# Patient Record
Sex: Female | Born: 1970 | Race: White | Hispanic: No | Marital: Married | Smoking: Current every day smoker
Health system: Southern US, Community
[De-identification: ages and names within clinical notes are randomized; demographics above are authoritative.]

## PROBLEM LIST (undated history)

## (undated) DIAGNOSIS — M1990 Unspecified osteoarthritis, unspecified site: Secondary | ICD-10-CM

## (undated) DIAGNOSIS — E785 Hyperlipidemia, unspecified: Secondary | ICD-10-CM

## (undated) DIAGNOSIS — T7840XA Allergy, unspecified, initial encounter: Secondary | ICD-10-CM

## (undated) DIAGNOSIS — F419 Anxiety disorder, unspecified: Secondary | ICD-10-CM

## (undated) DIAGNOSIS — Z8619 Personal history of other infectious and parasitic diseases: Secondary | ICD-10-CM

## (undated) DIAGNOSIS — I499 Cardiac arrhythmia, unspecified: Secondary | ICD-10-CM

## (undated) DIAGNOSIS — Z8489 Family history of other specified conditions: Secondary | ICD-10-CM

## (undated) HISTORY — PX: HAND SURGERY: SHX662

## (undated) HISTORY — DX: Allergy, unspecified, initial encounter: T78.40XA

## (undated) HISTORY — PX: OTHER SURGICAL HISTORY: SHX169

## (undated) HISTORY — DX: Unspecified osteoarthritis, unspecified site: M19.90

## (undated) HISTORY — DX: Personal history of other infectious and parasitic diseases: Z86.19

## (undated) HISTORY — DX: Hyperlipidemia, unspecified: E78.5

---

## 1997-10-18 ENCOUNTER — Inpatient Hospital Stay (HOSPITAL_COMMUNITY): Admission: AD | Admit: 1997-10-18 | Discharge: 1997-10-20 | Payer: Self-pay | Admitting: Obstetrics & Gynecology

## 1997-11-15 ENCOUNTER — Other Ambulatory Visit: Admission: RE | Admit: 1997-11-15 | Discharge: 1997-11-15 | Payer: Self-pay | Admitting: Obstetrics & Gynecology

## 1999-01-23 ENCOUNTER — Other Ambulatory Visit: Admission: RE | Admit: 1999-01-23 | Discharge: 1999-01-23 | Payer: Self-pay | Admitting: Obstetrics & Gynecology

## 2000-03-12 ENCOUNTER — Other Ambulatory Visit: Admission: RE | Admit: 2000-03-12 | Discharge: 2000-03-12 | Payer: Self-pay | Admitting: Obstetrics & Gynecology

## 2000-11-06 ENCOUNTER — Inpatient Hospital Stay (HOSPITAL_COMMUNITY): Admission: AD | Admit: 2000-11-06 | Discharge: 2000-11-08 | Payer: Self-pay | Admitting: Obstetrics and Gynecology

## 2001-02-26 ENCOUNTER — Other Ambulatory Visit: Admission: RE | Admit: 2001-02-26 | Discharge: 2001-02-26 | Payer: Self-pay | Admitting: Obstetrics and Gynecology

## 2002-03-22 ENCOUNTER — Other Ambulatory Visit: Admission: RE | Admit: 2002-03-22 | Discharge: 2002-03-22 | Payer: Self-pay | Admitting: Obstetrics and Gynecology

## 2002-10-01 ENCOUNTER — Encounter: Admission: RE | Admit: 2002-10-01 | Discharge: 2002-10-01 | Payer: Self-pay | Admitting: Internal Medicine

## 2002-10-01 ENCOUNTER — Encounter: Payer: Self-pay | Admitting: Internal Medicine

## 2003-03-25 ENCOUNTER — Ambulatory Visit (HOSPITAL_COMMUNITY): Admission: RE | Admit: 2003-03-25 | Discharge: 2003-03-25 | Payer: Self-pay | Admitting: Family Medicine

## 2003-03-28 ENCOUNTER — Other Ambulatory Visit: Admission: RE | Admit: 2003-03-28 | Discharge: 2003-03-28 | Payer: Self-pay | Admitting: Obstetrics and Gynecology

## 2004-04-06 ENCOUNTER — Other Ambulatory Visit: Admission: RE | Admit: 2004-04-06 | Discharge: 2004-04-06 | Payer: Self-pay | Admitting: Obstetrics and Gynecology

## 2005-04-15 ENCOUNTER — Other Ambulatory Visit: Admission: RE | Admit: 2005-04-15 | Discharge: 2005-04-15 | Payer: Self-pay | Admitting: Obstetrics and Gynecology

## 2007-11-04 ENCOUNTER — Encounter: Admission: RE | Admit: 2007-11-04 | Discharge: 2007-11-04 | Payer: Self-pay | Admitting: Internal Medicine

## 2010-06-29 NOTE — Discharge Summary (Signed)
[facility]  Patient:    Wendy Deleon, Wendy Deleon Visit Number: [id number] MRN: [id number]          Service Type: OBS Location: [id number] Attending Physician:  Melony Overly Dictated by:   Gerrit Friends. Aldona Bar, M.D. Admit Date:  11/06/2000 Disc. Date: 11/08/00                             Discharge Summary  DISCHARGE DIAGNOSES: 1. Term pregnancy, delivered, 9 pounds 2 ounce female infant.  Apgars 8 and 9. 2. Blood type A positive.  PROCEDURES: 1. Induction of labor. 2. Normal spontaneous delivery. 3. Epidural anesthesia.  SUMMARY:  This 40 year old gravida 3, para 2 was admitted at term for induction because of cervical dilation, history of rapid labor, and left fetal hydronephrosis.  At the time of admission she was 4 cm dilated with the vertex at minus 2 station.  She underwent amniotomy with production of clear fluid and had a normal course of labor with a subsequent normal spontaneous delivery of a viable female infant weighing 9 pounds 3 ounces over an intact perineum. The baby had a benign course, as did the mother.  The mothers discharge hemoglobin was 9.2 with a white count of 11,400 and a platelet count of 183,000.  On the morning of September 28, the mother was ready for discharge. She was having normal bowel and bladder functions.  She was afebrile.  Her bottle feeding was going well.  She was comfortable.  Vital signs were stable; and, accordingly, she was given all appropriate instructions and discharged.  DISCHARGE MEDICATIONS:  Ferrous sulfate 300 mg daily, she will finish her vitamins, and she was given a prescription for Motrin 600 mg to use every 6 hours as needed.  DISCHARGE INSTRUCTIONS:  She will return to the office in follow up in approximately four weeks time. Dictated by:   Gerrit Friends. Aldona Bar, M.D. Attending Physician:  Melony Overly DD:  11/08/00 TD:  11/08/00 Job: [id number]

## 2012-06-27 ENCOUNTER — Emergency Department (HOSPITAL_COMMUNITY)
Admission: EM | Admit: 2012-06-27 | Discharge: 2012-06-27 | Disposition: A | Discharge status: Other Acute Inpatient Hospital | Payer: 59 | Attending: Emergency Medicine | Admitting: Emergency Medicine

## 2012-06-27 ENCOUNTER — Encounter (HOSPITAL_COMMUNITY): Payer: Self-pay | Admitting: *Deleted

## 2012-06-27 ENCOUNTER — Emergency Department (HOSPITAL_COMMUNITY): Payer: 59

## 2012-06-27 DIAGNOSIS — F17200 Nicotine dependence, unspecified, uncomplicated: Secondary | ICD-10-CM | POA: Insufficient documentation

## 2012-06-27 DIAGNOSIS — W298XXA Contact with other powered powered hand tools and household machinery, initial encounter: Secondary | ICD-10-CM | POA: Insufficient documentation

## 2012-06-27 DIAGNOSIS — IMO0002 Reserved for concepts with insufficient information to code with codable children: Secondary | ICD-10-CM | POA: Insufficient documentation

## 2012-06-27 DIAGNOSIS — Y929 Unspecified place or not applicable: Secondary | ICD-10-CM | POA: Insufficient documentation

## 2012-06-27 DIAGNOSIS — S68119A Complete traumatic metacarpophalangeal amputation of unspecified finger, initial encounter: Secondary | ICD-10-CM

## 2012-06-27 DIAGNOSIS — Y9389 Activity, other specified: Secondary | ICD-10-CM | POA: Insufficient documentation

## 2012-06-27 LAB — CBC WITH DIFFERENTIAL/PLATELET
Basophils Relative: 0 % (ref 0–1)
Eosinophils Absolute: 0.2 10*3/uL (ref 0.0–0.7)
Eosinophils Relative: 2 % (ref 0–5)
Hemoglobin: 12.9 g/dL (ref 12.0–15.0)
MCH: 31.3 pg (ref 26.0–34.0)
MCHC: 34.3 g/dL (ref 30.0–36.0)
MCV: 91.3 fL (ref 78.0–100.0)
Monocytes Relative: 7 % (ref 3–12)
Neutrophils Relative %: 56 % (ref 43–77)
Platelets: 272 10*3/uL (ref 150–400)

## 2012-06-27 LAB — BASIC METABOLIC PANEL
BUN: 15 mg/dL (ref 6–23)
Calcium: 9.4 mg/dL (ref 8.4–10.5)
GFR calc Af Amer: >90 mL/min (ref >90)
GFR calc non Af Amer: >90 mL/min (ref >90)
Glucose, Bld: 108 mg/dL — ABNORMAL HIGH (ref 70–99)
Potassium: 3.3 mEq/L — ABNORMAL LOW (ref 3.5–5.1)

## 2012-06-27 LAB — TYPE AND SCREEN: ABO/RH(D): A POS

## 2012-06-27 LAB — ABO/RH: ABO/RH(D): A POS

## 2012-06-27 MED ORDER — TETANUS-DIPHTH-ACELL PERTUSSIS 5-2.5-18.5 LF-MCG/0.5 IM SUSP
0.5000 mL | Freq: Once | INTRAMUSCULAR | Status: AC
  Administered 2012-06-27: 0.5 mL via INTRAMUSCULAR
  Filled 2012-06-27: qty 0.5

## 2012-06-27 MED ORDER — CEFAZOLIN SODIUM 1-5 GM-% IV SOLN
1.0000 g | Freq: Once | INTRAVENOUS | Status: AC
  Administered 2012-06-27: 1 g via INTRAVENOUS
  Filled 2012-06-27: qty 50

## 2012-06-27 MED ORDER — HYDROMORPHONE HCL PF 1 MG/ML IJ SOLN
1.0000 mg | Freq: Once | INTRAMUSCULAR | Status: DC
  Filled 2012-06-27: qty 1

## 2012-06-27 MED ORDER — HYDROMORPHONE HCL PF 1 MG/ML IJ SOLN
INTRAMUSCULAR | Status: AC
  Administered 2012-06-27: 1 mg
  Filled 2012-06-27: qty 1

## 2012-06-27 MED ORDER — HYDROMORPHONE HCL PF 1 MG/ML IJ SOLN
1.0000 mg | Freq: Once | INTRAMUSCULAR | Status: AC
  Administered 2012-06-27: 1 mg via INTRAVENOUS
  Filled 2012-06-27: qty 1

## 2012-06-27 MED ORDER — SODIUM CHLORIDE 0.9 % IV BOLUS (SEPSIS)
1000.0000 mL | Freq: Once | INTRAVENOUS | Status: AC
  Administered 2012-06-27: 1000 mL via INTRAVENOUS

## 2012-06-27 MED ORDER — ONDANSETRON HCL 4 MG/2ML IJ SOLN
4.0000 mg | Freq: Once | INTRAMUSCULAR | Status: AC
  Administered 2012-06-27: 4 mg via INTRAVENOUS
  Filled 2012-06-27: qty 2

## 2012-06-27 NOTE — ED Notes (Signed)
Pt presents to ed via private vehicle with c/o left hand injury. Per husband pt was cutting wood on the saw table when injured. There is injury to left hand fingers.

## 2012-06-27 NOTE — ED Provider Notes (Signed)
History     CSN: [id number]  Arrival date & time 06/27/12  1305   First MD Initiated Contact with Patient 06/27/12 1307      No chief complaint on file.   (Consider location/radiation/quality/duration/timing/severity/associated sxs/prior treatment) The history is provided by the patient. The history is limited by the condition of the patient.  Wendy Deleon is a 42 y.o. female here with R hand injury. She was using a table saw and slipped and cut R hand. She said that the R 4th finger may be falling off. She was carried in by husband and unable to give much history due to pain. Tetanus not up to date. Denies being pregnant.   Level V caveat- condition of patient    History reviewed. No pertinent past medical history.  History reviewed. No pertinent past surgical history.  History reviewed. No pertinent family history.  History  Substance Use Topics  . Smoking status: Current Every Day Smoker -- 1.00 packs/day    Types: Cigarettes  . Smokeless tobacco: Not on file  . Alcohol Use: No    OB History   Grav Para Term Preterm Abortions TAB SAB Ect Mult Living                  Review of Systems  Musculoskeletal:       R hand pain   All other systems reviewed and are negative.    Allergies  Review of patient's allergies indicates no known allergies.  Home Medications  No current outpatient prescriptions on file.  LMP 06/19/2012  Physical Exam  Nursing note and vitals reviewed. Constitutional: She is oriented to person, place, and time.  Uncomfortable   HENT:  Head: Normocephalic and atraumatic.  Mouth/Throat: Oropharynx is clear and moist.  Eyes: Conjunctivae are normal. Pupils are equal, round, and reactive to light.  Neck: Normal range of motion. Neck supple.  Cardiovascular: Normal rate, regular rhythm and normal heart sounds.   Pulmonary/Chest: Effort normal and breath sounds normal. No respiratory distress. She has no wheezes. She has no rales.    Abdominal: Soft. Bowel sounds are normal. She exhibits no distension. There is no tenderness. There is no rebound and no guarding.  Musculoskeletal:       Hands: R second to 4th distal phalanx with obvious separation from the hand. Numerous tendons exposed. Nl sensation in hand, no sensation on finger tips. Unable to move the finger tips. Poor capillary refill.   Neurological: She is alert and oriented to person, place, and time.  Skin: Skin is warm and dry.  Psychiatric: She has a normal mood and affect. Her behavior is normal. Judgment and thought content normal.    ED Course  Procedures (including critical care time)  CRITICAL CARE Performed by: Silverio Lay, Kwali Wrinkle   Total critical care time: 30 min   Critical care time was exclusive of separately billable procedures and treating other patients.  Critical care was necessary to treat or prevent imminent or life-threatening deterioration.  Critical care was time spent personally by me on the following activities: development of treatment plan with patient and/or surrogate as well as nursing, discussions with consultants, evaluation of patient's response to treatment, examination of patient, obtaining history from patient or surrogate, ordering and performing treatments and interventions, ordering and review of laboratory studies, ordering and review of radiographic studies, pulse oximetry and re-evaluation of patient's condition.   Labs Reviewed  CBC WITH DIFFERENTIAL - Abnormal; Notable for the following:    WBC 12.5 (*)  Lymphs Abs 4.4 (*)    All other components within normal limits  BASIC METABOLIC PANEL - Abnormal; Notable for the following:    Potassium 3.3 (*)    Glucose, Bld 108 (*)    All other components within normal limits  PROTIME-INR  TYPE AND SCREEN  ABO/RH   Dg Hand 2 View Right  06/27/2012   *RADIOLOGY REPORT*  Clinical Data: Table saw injury with amputation of multiple fingers.  RIGHT HAND - 2 VIEW  Comparison:  None.  Findings: There are comminuted fractures of the proximal phalangeal bones of the second through fifth digits.  There are also fractures of the middle phalangeal bone of the index finger and possibly of other middle phalangeal bones.  IMPRESSION: Multiple fractures of the phalangeal bones as described.   Original Report Authenticated By: Francene Boyers, M.D.     No diagnosis found.   Date: 06/27/2012  Rate: 64  Rhythm: normal sinus rhythm  QRS Axis: normal  Intervals: normal  ST/T Wave abnormalities: nonspecific ST changes  Conduction Disutrbances:none  Narrative Interpretation:   Old EKG Reviewed: none available    MDM  Wendy Deleon is a 42 y.o. female here with s/p R hand injury. She has obvious open fractures with partial amputation of 2nd, 3rd, and 4th and 5th phalanx. Tetanus updated. Given ancef. I called Dr. Janee Morn from hand, who said that she will need to be transferred. I called hand surgeon at [facility], Dr. Ike Bene, who accepted the transfer and wants to meet patient in the ED. I talked to Dr. Tresa Endo in the ED, who is aware of the patient.        Richardean Canal, MD 06/27/12 1420

## 2012-06-27 NOTE — ED Notes (Signed)
Pt reports taking 3 aleve this am for ovarian cyst pain. Pt reports last time she ate solid food was around noon today.

## 2012-12-31 ENCOUNTER — Other Ambulatory Visit: Payer: Self-pay | Admitting: Obstetrics & Gynecology

## 2012-12-31 DIAGNOSIS — R928 Other abnormal and inconclusive findings on diagnostic imaging of breast: Secondary | ICD-10-CM

## 2013-01-19 ENCOUNTER — Ambulatory Visit
Admission: RE | Admit: 2013-01-19 | Discharge: 2013-01-19 | Disposition: A | Discharge status: Home / Self Care | Payer: 59 | Source: Ambulatory Visit | Attending: Obstetrics & Gynecology | Admitting: Obstetrics & Gynecology

## 2013-01-19 DIAGNOSIS — R928 Other abnormal and inconclusive findings on diagnostic imaging of breast: Secondary | ICD-10-CM

## 2014-05-02 IMAGING — CR DG HAND 2V*R*
1 series · 3 of 3 positions shown · non-contrast
Comparison: None.

CLINICAL DATA: Table saw injury with amputation of multiple
fingers.

RIGHT HAND - 2 VIEW

[Series 1: PA · right · 3 of 3 slices shown]
[im 1/3]
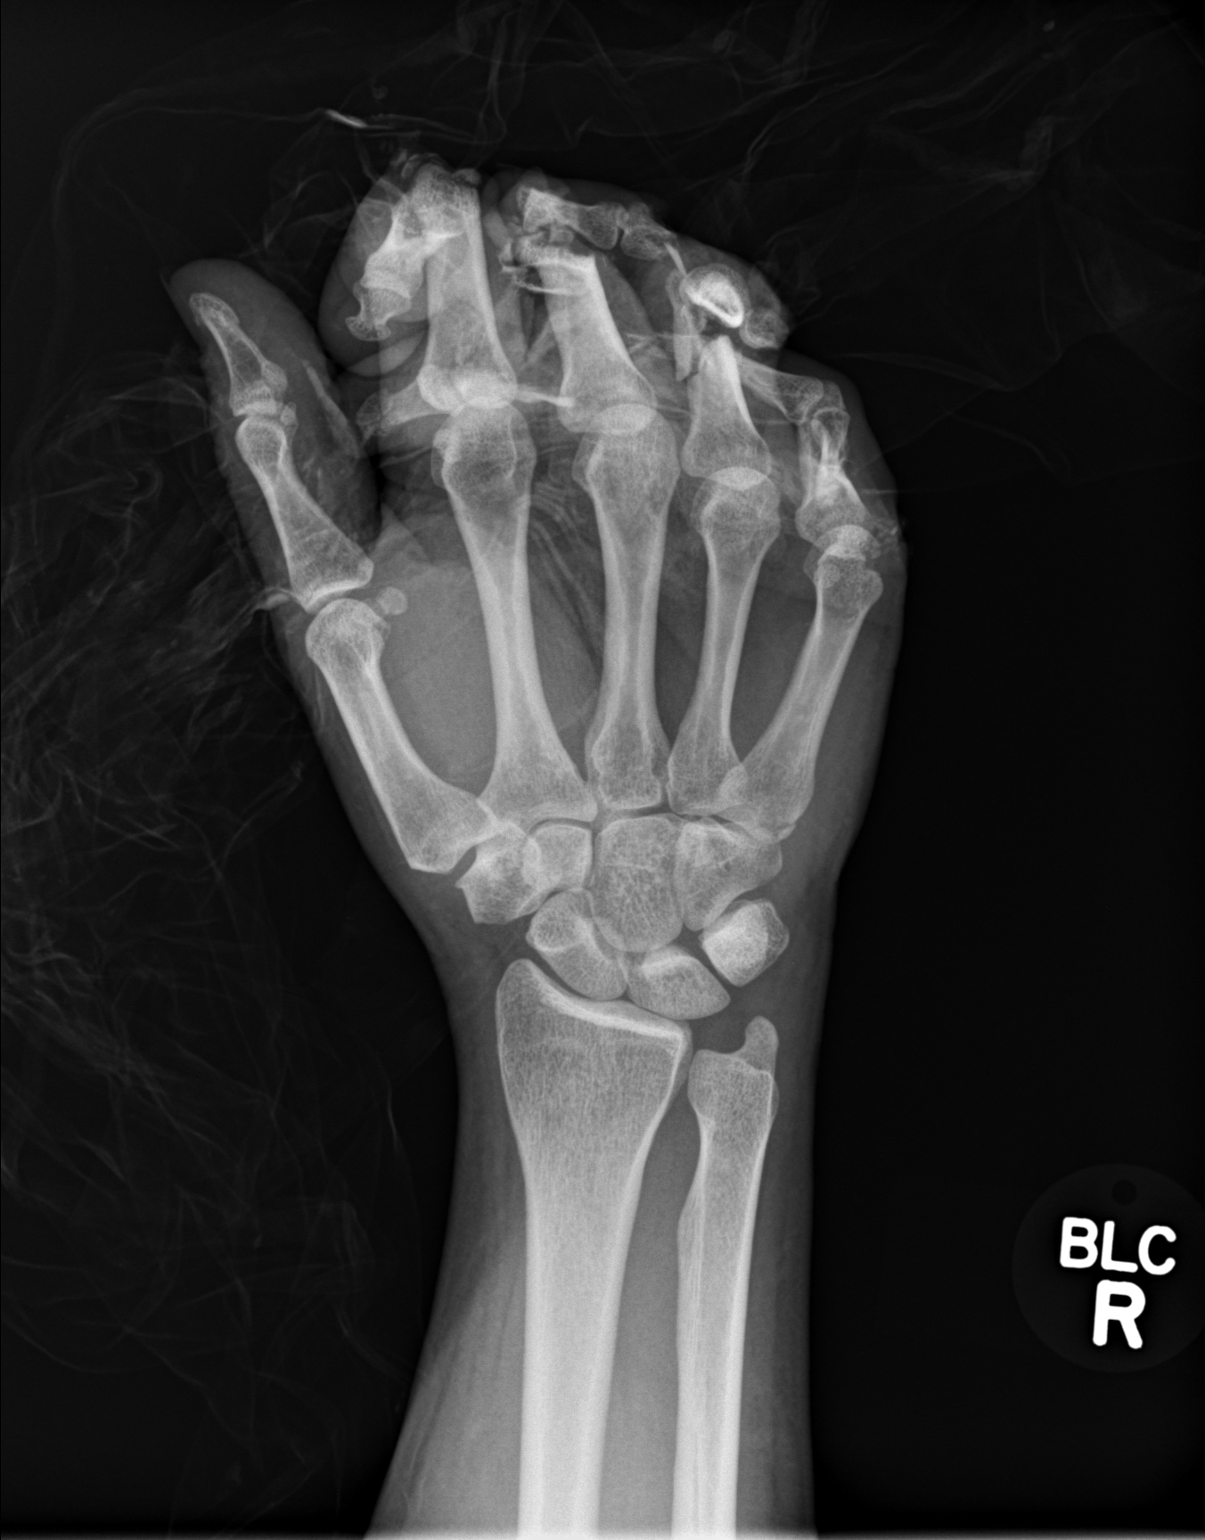
[im 2/3]
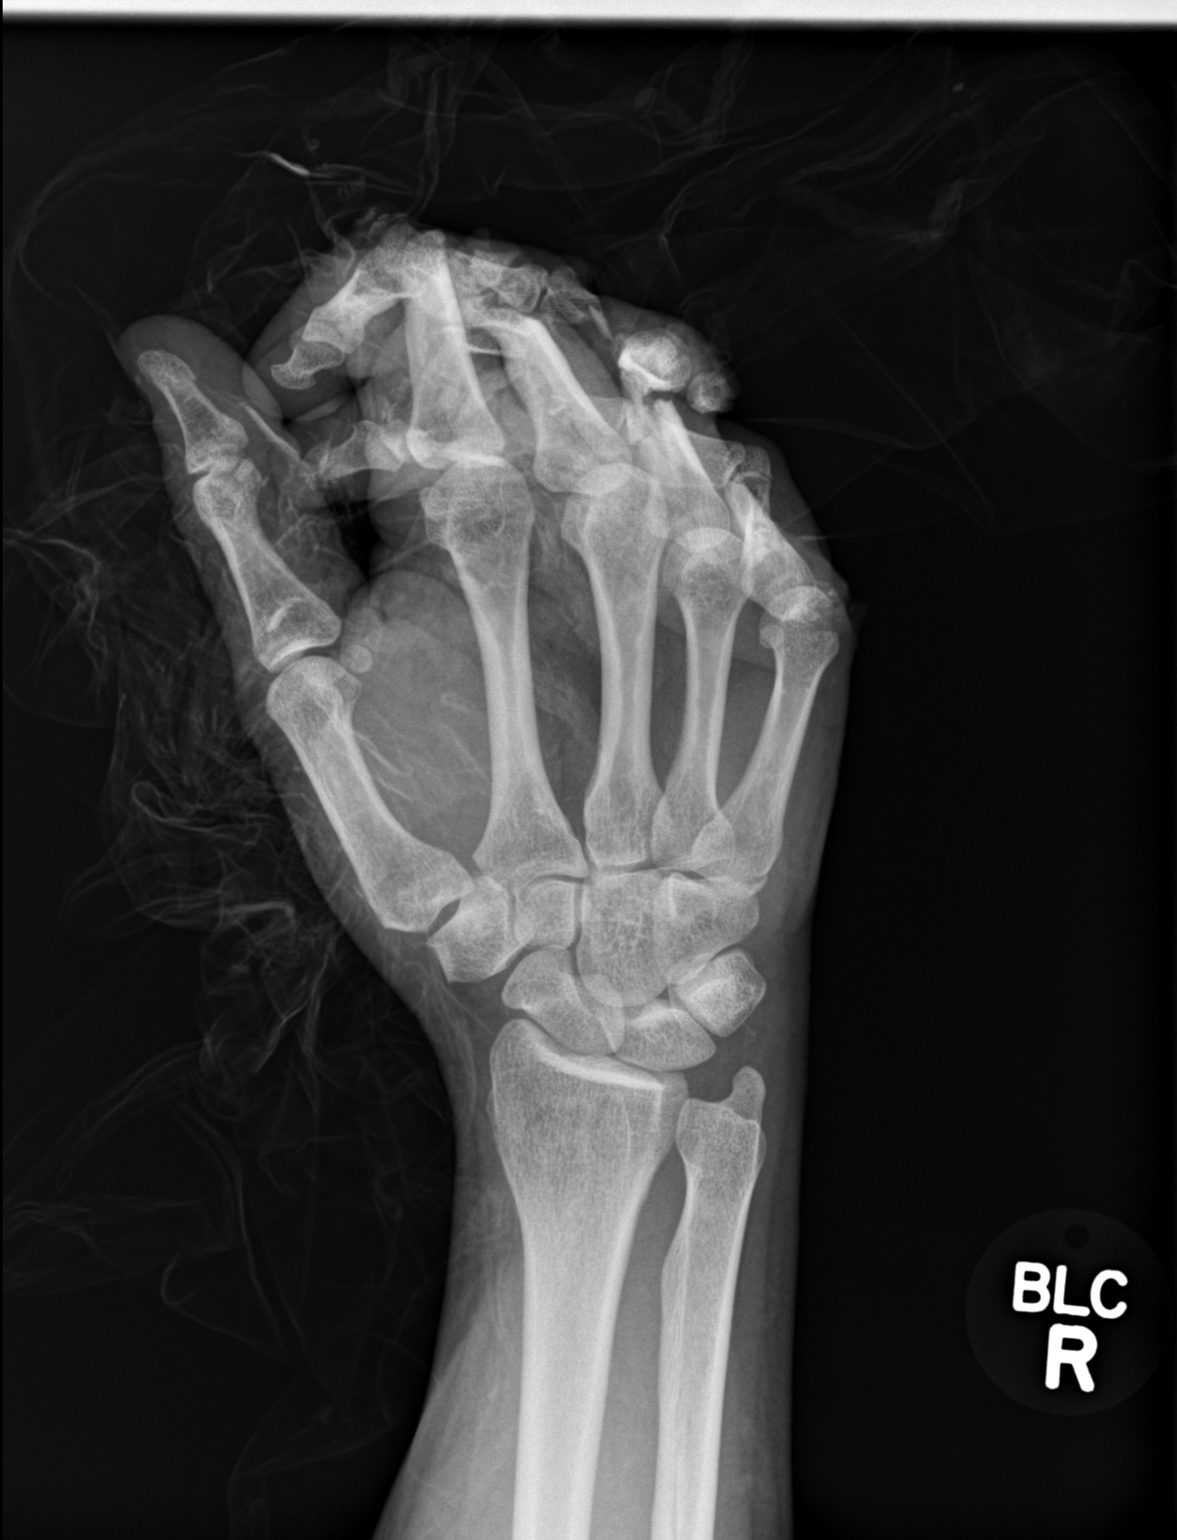
[im 3/3]
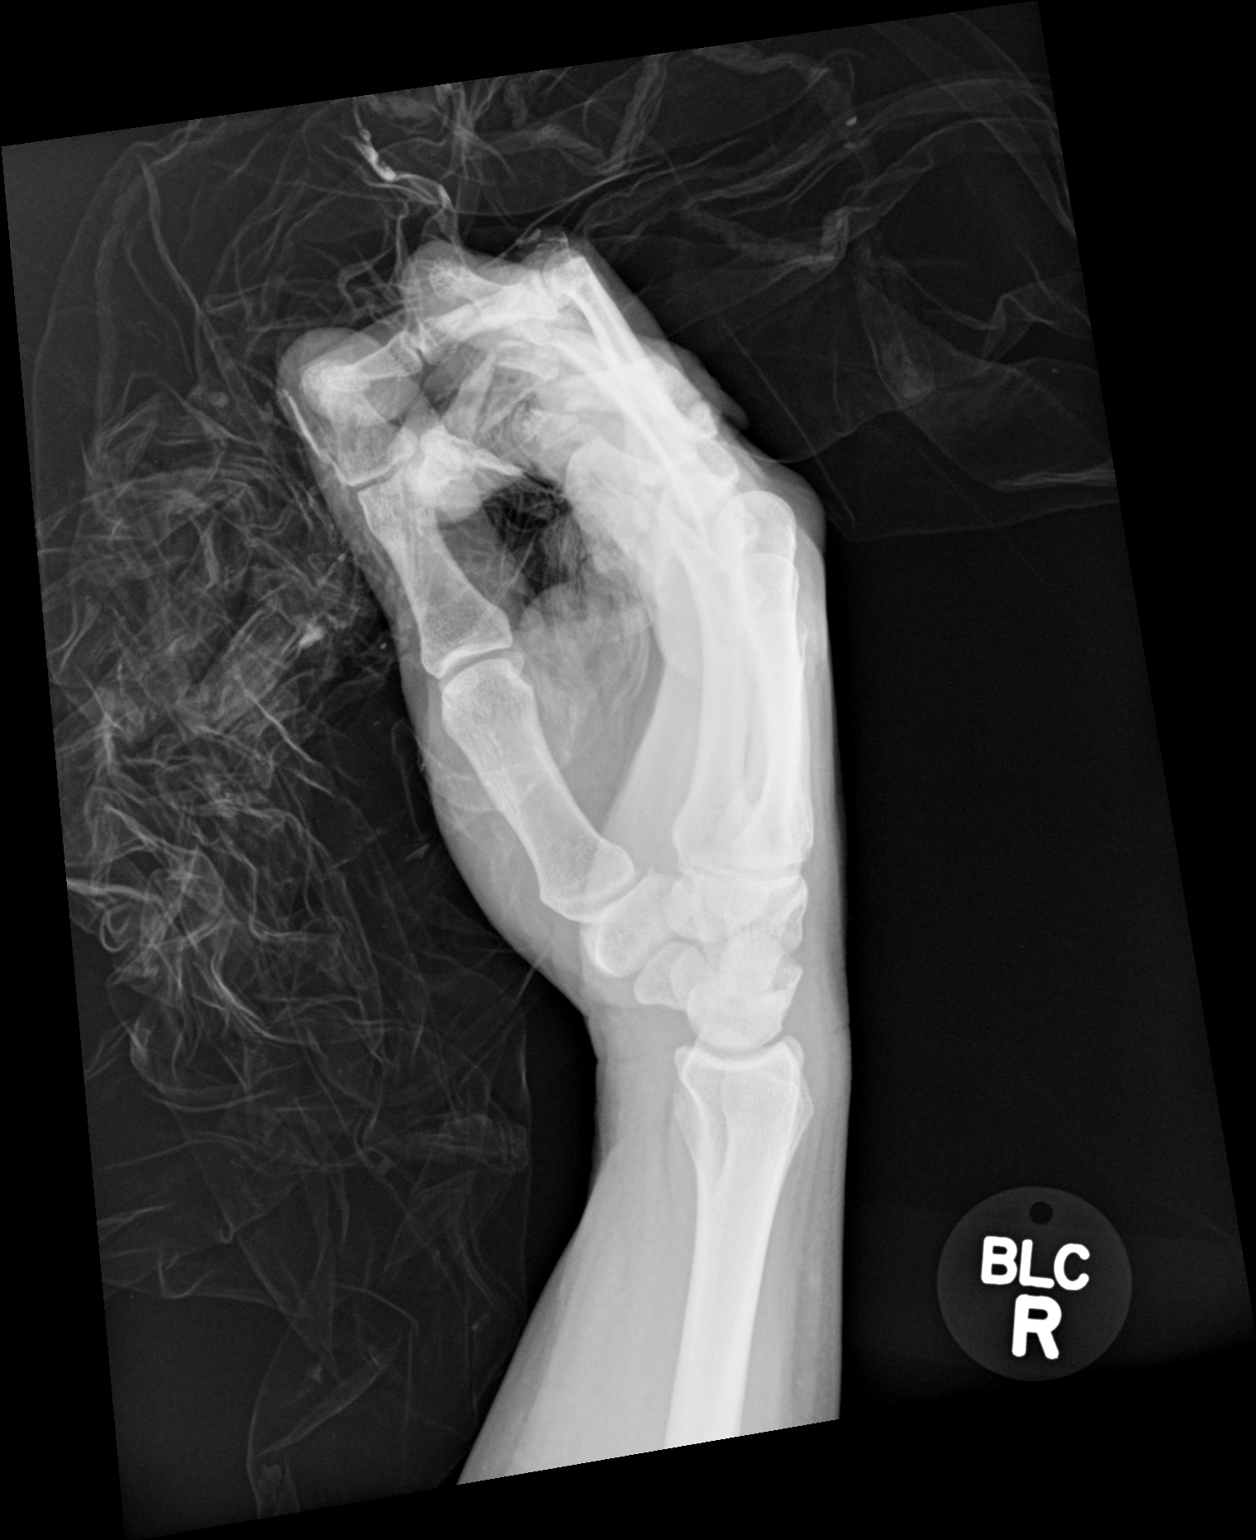

[3 of 3 positions shown; findings below may reference images not displayed]

FINDINGS: There are comminuted fractures of the proximal phalangeal
bones of the second through fifth digits.  There are also fractures
of the middle phalangeal bone of the index finger and possibly of
other middle phalangeal bones.
IMPRESSION: Multiple fractures of the phalangeal bones as described.

## 2014-05-23 ENCOUNTER — Other Ambulatory Visit: Payer: Self-pay

## 2014-05-23 ENCOUNTER — Other Ambulatory Visit: Payer: Self-pay | Admitting: Internal Medicine

## 2014-05-23 DIAGNOSIS — Z1231 Encounter for screening mammogram for malignant neoplasm of breast: Secondary | ICD-10-CM

## 2014-06-01 ENCOUNTER — Other Ambulatory Visit: Payer: Self-pay

## 2014-06-01 ENCOUNTER — Encounter (INDEPENDENT_AMBULATORY_CARE_PROVIDER_SITE_OTHER): Payer: Self-pay

## 2014-06-01 ENCOUNTER — Ambulatory Visit
Admission: RE | Admit: 2014-06-01 | Discharge: 2014-06-01 | Disposition: A | Discharge status: Home / Self Care | Payer: 59 | Source: Ambulatory Visit

## 2014-06-01 DIAGNOSIS — Z1231 Encounter for screening mammogram for malignant neoplasm of breast: Secondary | ICD-10-CM

## 2015-12-06 ENCOUNTER — Other Ambulatory Visit: Payer: Self-pay | Admitting: Obstetrics & Gynecology

## 2015-12-06 DIAGNOSIS — Z1231 Encounter for screening mammogram for malignant neoplasm of breast: Secondary | ICD-10-CM

## 2016-01-16 ENCOUNTER — Ambulatory Visit
Admission: RE | Admit: 2016-01-16 | Discharge: 2016-01-16 | Disposition: A | Discharge status: Home / Self Care | Payer: 59 | Source: Ambulatory Visit | Attending: Obstetrics & Gynecology | Admitting: Obstetrics & Gynecology

## 2016-01-16 DIAGNOSIS — Z1231 Encounter for screening mammogram for malignant neoplasm of breast: Secondary | ICD-10-CM

## 2016-12-17 ENCOUNTER — Other Ambulatory Visit: Payer: Self-pay | Admitting: Obstetrics & Gynecology

## 2016-12-17 DIAGNOSIS — Z1231 Encounter for screening mammogram for malignant neoplasm of breast: Secondary | ICD-10-CM

## 2017-01-16 ENCOUNTER — Ambulatory Visit
Admission: RE | Admit: 2017-01-16 | Discharge: 2017-01-16 | Disposition: A | Discharge status: Home / Self Care | Payer: 59 | Source: Ambulatory Visit | Attending: Obstetrics & Gynecology | Admitting: Obstetrics & Gynecology

## 2017-01-16 DIAGNOSIS — Z1231 Encounter for screening mammogram for malignant neoplasm of breast: Secondary | ICD-10-CM

## 2018-11-18 ENCOUNTER — Other Ambulatory Visit: Payer: Self-pay | Admitting: Obstetrics & Gynecology

## 2018-11-18 DIAGNOSIS — Z1231 Encounter for screening mammogram for malignant neoplasm of breast: Secondary | ICD-10-CM

## 2019-01-05 ENCOUNTER — Ambulatory Visit
Admission: RE | Admit: 2019-01-05 | Discharge: 2019-01-05 | Disposition: A | Discharge status: Home / Self Care | Payer: 59 | Source: Ambulatory Visit | Attending: Obstetrics & Gynecology | Admitting: Obstetrics & Gynecology

## 2019-01-05 ENCOUNTER — Other Ambulatory Visit: Payer: Self-pay

## 2019-01-05 DIAGNOSIS — Z1231 Encounter for screening mammogram for malignant neoplasm of breast: Secondary | ICD-10-CM

## 2019-02-19 ENCOUNTER — Telehealth: Payer: Self-pay | Admitting: *Deleted

## 2019-02-19 ENCOUNTER — Other Ambulatory Visit: Payer: Self-pay

## 2019-02-19 ENCOUNTER — Ambulatory Visit: Payer: 59 | Admitting: Family Medicine

## 2019-02-19 ENCOUNTER — Encounter: Payer: Self-pay | Admitting: Family Medicine

## 2019-02-19 VITALS — BP 138/70 | HR 84 | Temp 98.2°F | Ht 63.0 in | Wt 161.8 lb

## 2019-02-19 DIAGNOSIS — F17200 Nicotine dependence, unspecified, uncomplicated: Secondary | ICD-10-CM

## 2019-02-19 DIAGNOSIS — F419 Anxiety disorder, unspecified: Secondary | ICD-10-CM | POA: Diagnosis not present

## 2019-02-19 DIAGNOSIS — Z79899 Other long term (current) drug therapy: Secondary | ICD-10-CM | POA: Diagnosis not present

## 2019-02-19 DIAGNOSIS — M79641 Pain in right hand: Secondary | ICD-10-CM

## 2019-02-19 DIAGNOSIS — M5441 Lumbago with sciatica, right side: Secondary | ICD-10-CM

## 2019-02-19 DIAGNOSIS — G8929 Other chronic pain: Secondary | ICD-10-CM

## 2019-02-19 DIAGNOSIS — E785 Hyperlipidemia, unspecified: Secondary | ICD-10-CM | POA: Diagnosis not present

## 2019-02-19 DIAGNOSIS — Z131 Encounter for screening for diabetes mellitus: Secondary | ICD-10-CM

## 2019-02-19 LAB — COMPREHENSIVE METABOLIC PANEL
ALT: 13 U/L (ref 0–35)
AST: 13 U/L (ref 0–37)
Albumin: 4.5 g/dL (ref 3.5–5.2)
Alkaline Phosphatase: 112 U/L (ref 39–117)
BUN: 21 mg/dL (ref 6–23)
CO2: 25 mEq/L (ref 19–32)
Calcium: 10 mg/dL (ref 8.4–10.5)
Chloride: 103 mEq/L (ref 96–112)
Creatinine, Ser: 0.6 mg/dL (ref 0.40–1.20)
GFR: 107.17 mL/min (ref >60.00)
Glucose, Bld: 78 mg/dL (ref 70–99)
Potassium: 4.2 mEq/L (ref 3.5–5.1)
Sodium: 137 mEq/L (ref 135–145)
Total Bilirubin: 0.5 mg/dL (ref 0.2–1.2)
Total Protein: 7.1 g/dL (ref 6.0–8.3)

## 2019-02-19 LAB — LIPID PANEL
Cholesterol: 308 mg/dL — ABNORMAL HIGH (ref 0–200)
HDL: 67.6 mg/dL (ref >39.00)
LDL Cholesterol: 220 mg/dL — ABNORMAL HIGH (ref 0–99)
NonHDL: 240.28
Total CHOL/HDL Ratio: 5
Triglycerides: 101 mg/dL (ref 0.0–149.0)
VLDL: 20.2 mg/dL (ref 0.0–40.0)

## 2019-02-19 LAB — CBC WITH DIFFERENTIAL/PLATELET
Basophils Absolute: 0.1 10*3/uL (ref 0.0–0.1)
Basophils Relative: 0.7 % (ref 0.0–3.0)
Eosinophils Absolute: 0.3 10*3/uL (ref 0.0–0.7)
Eosinophils Relative: 3.2 % (ref 0.0–5.0)
HCT: 42.3 % (ref 36.0–46.0)
Hemoglobin: 14.3 g/dL (ref 12.0–15.0)
Lymphocytes Relative: 30.8 % (ref 12.0–46.0)
Lymphs Abs: 3.1 10*3/uL (ref 0.7–4.0)
MCHC: 33.8 g/dL (ref 30.0–36.0)
MCV: 94.9 fl (ref 78.0–100.0)
Monocytes Absolute: 0.8 10*3/uL (ref 0.1–1.0)
Monocytes Relative: 7.4 % (ref 3.0–12.0)
Neutro Abs: 5.9 10*3/uL (ref 1.4–7.7)
Neutrophils Relative %: 57.9 % (ref 43.0–77.0)
Platelets: 223 10*3/uL (ref 150.0–400.0)
RBC: 4.46 Mil/uL (ref 3.87–5.11)
RDW: 13.2 % (ref 11.5–15.5)
WBC: 10.2 10*3/uL (ref 4.0–10.5)

## 2019-02-19 LAB — TSH: TSH: 1.09 u[IU]/mL (ref 0.35–4.50)

## 2019-02-19 MED ORDER — DIAZEPAM 5 MG PO TABS: 5.0000 mg | ORAL_TABLET | Freq: Every evening | ORAL | 0 refills | Status: DC | PRN

## 2019-02-19 MED ORDER — BUPROPION HCL ER (SR) 150 MG PO TB12: 150.0000 mg | ORAL_TABLET | Freq: Two times a day (BID) | ORAL | 2 refills | Status: DC

## 2019-02-19 MED ORDER — TRAMADOL HCL 50 MG PO TABS: 50.0000 mg | ORAL_TABLET | Freq: Three times a day (TID) | ORAL | 0 refills | Status: DC

## 2019-02-19 NOTE — Progress Notes (Signed)
Wendy Deleon DOB: [birth year] Encounter date: 02/19/2019  This is a 49 y.o. female who presents to establish care. Chief Complaint  Patient presents with  . Establish Care    History of present illness: Would like to quit smoking.  Has been smoking since age 74.  She quit for about 11 months and 98, but for the last 6 years smoking 1-1/2 packs/day.  She worries about trying Chantix because of side effects that she has heard about.  She had an allergic reaction to the adhesives in nicotine patches.  She has issues with things tasting weird so worried that she would not do well with the gums.  Following with Dr. Ethelene Hal at [facility] ortho for back and there are things on MRI that she is concerned about. Having a really hard time with this. Was at point where she couldn't even walk. She is supposed to get another injection on the 19th. Labor day weekend helped husband pick something heavy up, and that is what set off her back. Today she is getting radiation down right hip; lower back more on right.   Cut off fingers in hand in 2014 and had surgical repair - cut through all four digits and partial thumb. Had 4 surgeries. Has chronic pain from this. Cold weather and rainy weather affect it. Bothers her if she tries to do too much. Hand spasms; ultra sensitive. Has a lot of nerve pain. Was taking gabapentin but got leg cramps with this so didn't like it. Seemed to trigger anxiety, leg cramps. Didn't feel like it was actually helping with nerve pain. Was up to 300mg  TID. Feels better off of medication.   Has had some recent anxiety. Not sure what triggered but may have been hand accident. If she hears table saw it used to really set her off but she is better with this.   Diazepam was given to her when she had hand accident - given to her for hand spasms. Has taken ever since. Has had allergy to flexeril, tizanidine. Was just using for bad anxiety or hand spasms. Thinks using once or twice weekly. Has  been taking it nightly. Otherwise can't sleep.   Dr. has given 2 prescriptions of tramadol, but prior PCP was giving to her for her hand. Would have some days that she would not take, but other periods that she would take regularly. Taking tramadol up to twice daily; some days not at all.   Follows with Dr. Ethelene Hal (gyn) regularly. Last pap per patient was Feb 2020. LMP 02/03/19. Husband with vasectomy.   Has seen derm, but not seeing them regularly   Past Medical History:  Diagnosis Date  . Allergy   . Arthritis   . History of chicken pox    Past Surgical History:  Procedure Laterality Date  . HAND SURGERY Right    2014, 2016  . lumbar     DDD-treated by Dr 04-03-2004   Allergies  Allergen Reactions  . Eggs Or Egg-Derived Products Other (See Comments)    Vomiting, stomach swelling and diarrhea  . Flexeril [Cyclobenzaprine]     Tongue swelling, throat burning, "feels weird to breathe"  . Hydrocodone     migraine  . Oxycodone     Feels over-medicated  . Tizanidine     "throat feels weird"   Current Meds  Medication Sig  . Cyanocobalamin (VITAMIN B-12 PO) Take by mouth daily.  . diazepam (VALIUM) 5 MG tablet Take 1 tablet (5 mg total) by  mouth at bedtime as needed for anxiety.  . traMADol (ULTRAM) 50 MG tablet Take 1 tablet (50 mg total) by mouth 3 (three) times daily.  . [DISCONTINUED] diazepam (VALIUM) 5 MG tablet Take 5 mg by mouth at bedtime as needed for anxiety.  . [DISCONTINUED] traMADol (ULTRAM) 50 MG tablet Take 50 mg by mouth 3 (three) times daily.   Social History   Tobacco Use  . Smoking status: Current Every Day Smoker    Packs/day: 1.00    Types: Cigarettes  . Smokeless tobacco: Former Engineer, water Use Topics  . Alcohol use: No   Family History  Problem Relation Age of Onset  . Arthritis Mother   . Diabetes Mother   . Hypertension Mother   . Kidney disease Mother   . Miscarriages / [place] Mother   . Arthritis Father   . Hearing loss  Father   . High Cholesterol Father   . Hypertension Father   . Alcohol abuse Sister   . Arthritis Maternal Grandmother   . Hearing loss Maternal Grandmother   . Heart attack Maternal Grandmother   . High blood pressure Maternal Grandmother   . Kidney disease Maternal Grandmother   . Stroke Maternal Grandmother 80  . Arthritis Maternal Grandfather   . Hearing loss Maternal Grandfather   . Heart attack Maternal Grandfather   . Heart disease Maternal Grandfather   . High blood pressure Maternal Grandfather   . Arthritis/Rheumatoid Paternal Grandmother   . Hearing loss Paternal Grandmother   . Heart attack Paternal Grandmother   . Heart disease Paternal Grandmother   . Kidney disease Paternal Grandmother   . Other Paternal Grandfather        no medical history known  . Asthma Daughter   . Migraines Daughter   . Learning disabilities Daughter      Review of Systems  Constitutional: Negative for chills, fatigue and fever.  Respiratory: Negative for cough, chest tightness, shortness of breath and wheezing.   Cardiovascular: Negative for chest pain, palpitations and leg swelling.    Objective:  BP 138/70 (BP Location: Left Arm, Patient Position: Sitting, Cuff Size: Normal)   Pulse 84   Temp 98.2 F (36.8 C) (Temporal)   Ht 5\' 3"  (1.6 m)   Wt 161 lb 12.8 oz (73.4 kg)   LMP 02/03/2019 (Exact Date)   SpO2 98%   BMI 28.66 kg/m   Weight: 161 lb 12.8 oz (73.4 kg)   BP Readings from Last 3 Encounters:  02/19/19 138/70  06/27/12 123/71   Wt Readings from Last 3 Encounters:  02/19/19 161 lb 12.8 oz (73.4 kg)    Physical Exam Constitutional:      General: She is not in acute distress.    Appearance: She is well-developed.  Cardiovascular:     Rate and Rhythm: Normal rate and regular rhythm.     Heart sounds: Normal heart sounds. No murmur. No friction rub.  Pulmonary:     Effort: Pulmonary effort is normal. No respiratory distress.     Breath sounds: Normal breath  sounds. No wheezing or rales.  Musculoskeletal:     Right lower leg: No edema.     Left lower leg: No edema.     Comments: Post surgical changes right fingers from amputation  Neurological:     Mental Status: She is alert and oriented to person, place, and time.  Psychiatric:        Behavior: Behavior normal.     Assessment/Plan:  1. Chronic  low back pain with right-sided sciatica, unspecified back pain laterality She is currently following with Dr. Nelva Bush and getting injections.  We discussed limiting pain medications to only when needed.  I am okay with refilling as she is undergoing current evaluation treatment with Dr. Nelva Bush, but if she is unable to decrease the amount of medication she is taking she may need to see pain management in the future. - traMADol (ULTRAM) 50 MG tablet; Take 1 tablet (50 mg total) by mouth 3 (three) times daily.  Dispense: 90 tablet; Refill: 0  2. Chronic pain of right hand Usually with intermittent use of Tylenol, but with current back pain sometimes requiring tramadol more often.  We will revisit need for pain medication in 3 months time at that time we can sign a narcotics contract.  We did review that controlled substances should only be coming from one provider. [place] controlled substances database was reviewed.   - traMADol (ULTRAM) 50 MG tablet; Take 1 tablet (50 mg total) by mouth 3 (three) times daily.  Dispense: 90 tablet; Refill: 0  3. Anxiety Typically with intermittent use of diazepam.  This was started only after she had a traumatic injury and cut off all of her fingers with a table saw.  Anxiety is better controlled now than it has been in the past, but now she is also getting benefit with diazepam for back pain and spasm. - diazepam (VALIUM) 5 MG tablet; Take 1 tablet (5 mg total) by mouth at bedtime as needed for anxiety.  Dispense: 30 tablet; Refill: 0  4. Tobacco dependence She is interested in quitting.  We discussed different  options, and agreed on Wellbutrin.  She will let me know if any problems with medication. Discussed new medication(s) today with patient. Discussed potential side effects and patient verbalized understanding.  - buPROPion (WELLBUTRIN SR) 150 MG 12 hr tablet; Take 1 tablet (150 mg total) by mouth 2 (two) times daily.  Dispense: 60 tablet; Refill: 2  5. Hyperlipidemia, unspecified hyperlipidemia type History of high cholesterol and was previously on treatment.  We will recheck for baseline. - Lipid panel; Future - TSH; Future - TSH - Lipid panel  6. Screening for diabetes mellitus Bloodwork today.  7. High risk medication use - CBC with Differential/Platelet; Future - Comprehensive metabolic panel; Future - Pain Mgmt, Profile 8 w/Conf, U; Future - Pain Mgmt, Profile 8 w/Conf, U - Comprehensive metabolic panel - CBC with Differential/Platelet  Return in about 3 months (around 05/20/2019) for physical exam.  Micheline Rough, MD  Of note, on review of MRI of the back from [facility] there were noted kidney cyst and possible liver hemangioma.  None of these were documented is being concerning and no follow-up imaging was recommended, but I think it would be reasonable to consider ultrasound of kidneys and liver in the future.

## 2019-02-19 NOTE — Telephone Encounter (Signed)
Patient screened when she came into the office.  Copied from CRM [id number]. Topic: General - Inquiry >> Feb 19, 2019 11:42 AM Leary Roca wrote: Reason for CRM: patient calling for covid screening please advise

## 2019-02-19 NOTE — Patient Instructions (Signed)
1-800-QUIT-NOW also available to help through quitting process.     

## 2019-02-20 ENCOUNTER — Encounter: Payer: Self-pay | Admitting: Family Medicine

## 2019-02-20 DIAGNOSIS — E785 Hyperlipidemia, unspecified: Secondary | ICD-10-CM | POA: Insufficient documentation

## 2019-02-20 DIAGNOSIS — M545 Low back pain, unspecified: Secondary | ICD-10-CM | POA: Insufficient documentation

## 2019-02-20 DIAGNOSIS — G8929 Other chronic pain: Secondary | ICD-10-CM | POA: Insufficient documentation

## 2019-02-21 LAB — PAIN MGMT, PROFILE 8 W/CONF, U
6 Acetylmorphine: NEGATIVE ng/mL
Alcohol Metabolites: NEGATIVE ng/mL (ref <500)
Alphahydroxyalprazolam: NEGATIVE ng/mL
Alphahydroxymidazolam: NEGATIVE ng/mL
Alphahydroxytriazolam: NEGATIVE ng/mL
Aminoclonazepam: NEGATIVE ng/mL
Amphetamines: NEGATIVE ng/mL
Benzodiazepines: POSITIVE ng/mL
Buprenorphine, Urine: NEGATIVE ng/mL
Cocaine Metabolite: NEGATIVE ng/mL
Creatinine: 67.6 mg/dL
Hydroxyethylflurazepam: NEGATIVE ng/mL
Lorazepam: NEGATIVE ng/mL
MDMA: NEGATIVE ng/mL
Marijuana Metabolite: NEGATIVE ng/mL
Nordiazepam: NEGATIVE ng/mL
Opiates: NEGATIVE ng/mL
Oxazepam: 102 ng/mL
Oxidant: NEGATIVE ug/mL
Oxycodone: NEGATIVE ng/mL
Temazepam: 84 ng/mL
pH: 5 (ref 4.5–9.0)

## 2019-02-23 ENCOUNTER — Other Ambulatory Visit: Payer: Self-pay | Admitting: Family Medicine

## 2019-02-23 MED ORDER — PRAVASTATIN SODIUM 20 MG PO TABS: 20.0000 mg | ORAL_TABLET | Freq: Every day | ORAL | 1 refills | Status: DC

## 2019-02-23 NOTE — Addendum Note (Signed)
Addended by: Johnella Moloney on: 02/23/2019 03:25 PM   Modules accepted: Orders

## 2019-02-26 ENCOUNTER — Ambulatory Visit: Payer: Self-pay | Admitting: *Deleted

## 2019-02-26 NOTE — Telephone Encounter (Signed)
Attempted to call patient, no answer.  Just wanted to check in and get more details and see if she needed to have a visit scheduled.  Please check in on Monday

## 2019-02-26 NOTE — Telephone Encounter (Signed)
Pt calling with complaints of sharp pains through breast and back that has been happening off and on since Tuesday. Pt states that initially she couldn't take a deep breath. Sharp pain would last a couple of minutes. Tuesday lasted for hours went away on Wed and it occurred again.pt Pt states that with lying on left side sharp pains and hurts to breathe.Pt states this occurred last night but went away.Pt denies chest pain or difficulty breathing at the time of call. Pt states she can feel something in her left boob with taking a deep breath. Pt states that she has experienced these symptoms about this time last year and was told by her physician at the symptoms were normal. Pt advised to go to ED if chest pain reoccurs or symptoms become worse. Pt verbalized understanding. Call transferred to [facility] in office to schedule virtual visit.  Reason for Disposition . [1] Chest pain lasts > 5 minutes AND [2] occurred > 3 days ago (72 hours) AND [3] NO chest pain or cardiac symptoms now  Answer Assessment - Initial Assessment Questions 1. LOCATION: "Where does it hurt?"       Left side with lying on  2. RADIATION: "Does the pain go anywhere else?" (e.g., into neck, jaw, arms, back)     When it first comes on it goes to neck and shoulder 3. ONSET: "When did the chest pain begin?" (Minutes, hours or days)      Comes and goes  4. PATTERN "Does the pain come and go, or has it been constant since it started?"  "Does it get worse with exertion?"      Comes and goes with lying on left side 5. DURATION: "How long does it last" (e.g., seconds, minutes, hours)     Hours on Wed, no complaint of pain currently 6. SEVERITY: "How bad is the pain?"  (e.g., Scale 1-10; mild, moderate, or severe)    - MILD (1-3): doesn't interfere with normal activities     - MODERATE (4-7): interferes with normal activities or awakens from sleep    - SEVERE (8-10): excruciating pain, unable to do any normal activities       8-9 7.  CARDIAC RISK FACTORS: "Do you have any history of heart problems or risk factors for heart disease?" (e.g., angina, prior heart attack; diabetes, high blood pressure, high cholesterol, smoker, or strong family history of heart disease)     Smoker, Heart diease on both sides of family 8. PULMONARY RISK FACTORS: "Do you have any history of lung disease?"  (e.g., blood clots in lung, asthma, emphysema, birth control pills)     No 9. CAUSE: "What do you think is causing the chest pain?"     Unknown 10. OTHER SYMPTOMS: "Do you have any other symptoms?" (e.g., dizziness, nausea, vomiting, sweating, fever, difficulty breathing, cough)       SOB when it occurs  11. PREGNANCY: "Is there any chance you are pregnant?" "When was your last menstrual period?"       no  Protocols used: CHEST PAIN-A-AH

## 2019-02-26 NOTE — Telephone Encounter (Signed)
Spoke with patient, offered a virtual visit with Dr. Salomon Fick for today, patient declined. Patient stated that if it happened again she would go to [facility] or ER as advised by the [facility] nurse. Patient informed that I would forward message to PCP.

## 2019-03-01 NOTE — Telephone Encounter (Signed)
Noted thanks °

## 2019-03-01 NOTE — Telephone Encounter (Signed)
Called patient to follow up and she stated that she was feeling a whole lot better. She stated that she must have pulled a muscle trying to stretch or something, but now she is feeling much better.

## 2019-03-18 ENCOUNTER — Other Ambulatory Visit: Payer: Self-pay | Admitting: Family Medicine

## 2019-03-18 DIAGNOSIS — F17200 Nicotine dependence, unspecified, uncomplicated: Secondary | ICD-10-CM

## 2019-05-21 ENCOUNTER — Other Ambulatory Visit: Payer: Self-pay

## 2019-05-21 ENCOUNTER — Ambulatory Visit (INDEPENDENT_AMBULATORY_CARE_PROVIDER_SITE_OTHER): Payer: 59 | Admitting: Family Medicine

## 2019-05-21 ENCOUNTER — Ambulatory Visit (INDEPENDENT_AMBULATORY_CARE_PROVIDER_SITE_OTHER): Payer: 59

## 2019-05-21 ENCOUNTER — Encounter: Payer: Self-pay | Admitting: Family Medicine

## 2019-05-21 VITALS — BP 112/80 | HR 90 | Temp 98.2°F | Ht 63.0 in | Wt 162.6 lb

## 2019-05-21 DIAGNOSIS — E785 Hyperlipidemia, unspecified: Secondary | ICD-10-CM | POA: Diagnosis not present

## 2019-05-21 DIAGNOSIS — M79641 Pain in right hand: Secondary | ICD-10-CM

## 2019-05-21 DIAGNOSIS — R079 Chest pain, unspecified: Secondary | ICD-10-CM

## 2019-05-21 DIAGNOSIS — E538 Deficiency of other specified B group vitamins: Secondary | ICD-10-CM

## 2019-05-21 DIAGNOSIS — R002 Palpitations: Secondary | ICD-10-CM

## 2019-05-21 DIAGNOSIS — M25512 Pain in left shoulder: Secondary | ICD-10-CM

## 2019-05-21 DIAGNOSIS — G8929 Other chronic pain: Secondary | ICD-10-CM
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
## 2019-05-22 LAB — LIPID PANEL
Cholesterol: 241 mg/dL — ABNORMAL HIGH (ref <200)
HDL: 62 mg/dL (ref >=50)
LDL Cholesterol (Calc): 159 mg/dL (calc) — ABNORMAL HIGH
Non-HDL Cholesterol (Calc): 179 mg/dL (calc) — ABNORMAL HIGH (ref <130)
Total CHOL/HDL Ratio: 3.9 (calc) (ref <5.0)
Triglycerides: 95 mg/dL (ref <150)

## 2019-05-22 LAB — VITAMIN B12: Vitamin B-12: 492 pg/mL (ref 200–1100)

## 2019-05-24 ENCOUNTER — Other Ambulatory Visit: Payer: Self-pay | Admitting: *Deleted

## 2019-05-24 MED ORDER — PRAVASTATIN SODIUM 40 MG PO TABS: 40.0000 mg | ORAL_TABLET | Freq: Every day | ORAL | 1 refills | Status: DC

## 2019-05-24 NOTE — Addendum Note (Signed)
Addended by: Johnella Moloney on: 05/24/2019 02:36 PM   Modules accepted: Orders

## 2019-05-27 ENCOUNTER — Other Ambulatory Visit: Payer: Self-pay | Admitting: Family Medicine

## 2019-05-27 DIAGNOSIS — R002 Palpitations: Secondary | ICD-10-CM

## 2019-06-22 ENCOUNTER — Telehealth (HOSPITAL_COMMUNITY): Payer: Self-pay | Admitting: Family Medicine

## 2019-06-22 NOTE — Telephone Encounter (Signed)
We received your order for Echocardiogram and I called patient to schedule.  She states that she has had COVID and now her daughter has it and she did not wish to schedule it and was not even sure she could afford test.  She did not wish to schedule.  Order will be removed from the WQ.

## 2019-06-23 NOTE — Telephone Encounter (Signed)
Can you just check in with Wendy Deleon and see how she is feeling? And daughters (also my patients)/family? We can discuss echo at future date or if she continues to have chest symptoms.

## 2019-06-23 NOTE — Telephone Encounter (Signed)
Left message to return phone call.

## 2019-06-24 NOTE — Telephone Encounter (Signed)
Noted  

## 2019-06-24 NOTE — Telephone Encounter (Signed)
Spoke with the pt and she stated she is feeling fine, daughter having difficulties with eating due to nausea and she will continue to try to get her eat .  Declines a virtual visit today and was advised to call back if needed.  Message sent to PCP.

## 2019-11-01 ENCOUNTER — Telehealth: Payer: Self-pay | Admitting: Family Medicine

## 2019-11-01 NOTE — Telephone Encounter (Signed)
Noted  

## 2019-11-01 NOTE — Telephone Encounter (Signed)
Patient would like for Dr. Hassan Rowan or her nurse to call her back, she has a few questions to ask. Patient did not give anymore information.  Please advise

## 2019-11-01 NOTE — Telephone Encounter (Signed)
Spoke with the pt and she stated she has been feeling overwhelmed, stressed and "feels like she is going to lose it" lately.  I offered an appt with another provider today or tomorrow  as PCP does not have any openings until Wedneday. Patient stated she will be OK to wait and I scheduled the pt a virtual appt per pts preference on Wednesday 9/22.  Patient also wanted to let Dr Hassan Rowan know she did not have the echo as she was awaiting insurance coverage.  Message sent to PCP.

## 2019-11-03 ENCOUNTER — Telehealth (INDEPENDENT_AMBULATORY_CARE_PROVIDER_SITE_OTHER): Payer: 59 | Admitting: Family Medicine

## 2019-11-03 ENCOUNTER — Encounter: Payer: Self-pay | Admitting: Family Medicine

## 2019-11-03 DIAGNOSIS — F419 Anxiety disorder, unspecified: Secondary | ICD-10-CM | POA: Diagnosis not present

## 2019-11-03 DIAGNOSIS — F4321 Adjustment disorder with depressed mood: Secondary | ICD-10-CM

## 2019-11-03 DIAGNOSIS — R002 Palpitations: Secondary | ICD-10-CM

## 2019-11-03 MED ORDER — CITALOPRAM HYDROBROMIDE 10 MG PO TABS: 10.0000 mg | ORAL_TABLET | Freq: Every day | ORAL | 1 refills | Status: DC
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
## 2019-11-04 ENCOUNTER — Telehealth: Payer: Self-pay | Admitting: *Deleted

## 2019-11-04 NOTE — Telephone Encounter (Signed)
-----   Message from Wynn Banker, MD sent at 11/03/2019  3:39 PM EDT ----- Please set up 1 month follow up (virtual ok for her)

## 2019-11-04 NOTE — Telephone Encounter (Signed)
Spoke with the pt and scheduled a virtual visit for 10/27 at 1pm.

## 2019-11-08 ENCOUNTER — Telehealth: Payer: Self-pay | Admitting: Radiology

## 2019-11-08 NOTE — Telephone Encounter (Signed)
Enrolled patient for a 30 day [employer] Event Monitor to be mailed to patients home. Brief instructions were gone over with the patient and she knows to expect the monitor to arrive in 3-4 days.  

## 2019-11-18 ENCOUNTER — Encounter (INDEPENDENT_AMBULATORY_CARE_PROVIDER_SITE_OTHER): Payer: 59

## 2019-11-18 DIAGNOSIS — R002 Palpitations: Secondary | ICD-10-CM

## 2019-12-06 ENCOUNTER — Other Ambulatory Visit: Payer: Self-pay | Admitting: Family Medicine

## 2019-12-08 ENCOUNTER — Other Ambulatory Visit: Payer: Self-pay

## 2019-12-08 ENCOUNTER — Encounter: Payer: Self-pay | Admitting: Family Medicine

## 2019-12-08 ENCOUNTER — Telehealth (INDEPENDENT_AMBULATORY_CARE_PROVIDER_SITE_OTHER): Payer: 59 | Admitting: Family Medicine

## 2019-12-08 DIAGNOSIS — F4321 Adjustment disorder with depressed mood: Secondary | ICD-10-CM | POA: Diagnosis not present

## 2019-12-08 DIAGNOSIS — F419 Anxiety disorder, unspecified: Secondary | ICD-10-CM | POA: Diagnosis not present

## 2019-12-08 NOTE — Progress Notes (Signed)
Virtual Visit via Video Note  I connected with Wendy Deleon  on 12/08/19 at  1:00 PM EDT by a video enabled telemedicine application and verified that I am speaking with the correct person using two identifiers.  Location patient: home Location provider: [facility]  [address] [place], [place] [address] Persons participating in the virtual visit: patient, provider  I discussed the limitations of evaluation and management by telemedicine and the availability of in person appointments. The patient expressed understanding and agreed to proceed.   Wendy Deleon DOB: [birth year] Encounter date: 12/08/2019  This is a 49 y.o. female who presents with Chief Complaint  Patient presents with  . Follow-up    History of present illness: Last virtual visit 11/03/19 discussed anxiety, grief. We started citalopram to help with these symptoms.   She is doing better than she was. They have made it through everything associated with passing of family member. Still has bad days, but not as bad. Very busy.   Not feeling as depressed, not as overwhelmed. Doesn't like being by herself.   Still has anxiety, but no where near what it was. She is a whole lot better. Still waking up through night, but actually getting some rest.   Wore heart monitor and it froze; had to get sent another one. Hasn't been as bad.    Allergies  Allergen Reactions  . Eggs Or Egg-Derived Products Other (See Comments)    Vomiting, stomach swelling and diarrhea  . Flexeril [Cyclobenzaprine]     Tongue swelling, throat burning, "feels weird to breathe"  . Hydrocodone     migraine  . Oxycodone     Feels over-medicated  . Tizanidine     "throat feels weird"   Current Meds  Medication Sig  . Acetaminophen (TYLENOL PO) Take by mouth.  . Cetirizine HCl (ZYRTEC PO) Take by mouth.  . citalopram (CELEXA) 10 MG tablet Take 1 tablet (10 mg total) by mouth daily.  . Cyanocobalamin  (VITAMIN B-12 PO) Take by mouth daily.  . diazepam (VALIUM) 5 MG tablet Take 1 tablet (5 mg total) by mouth at bedtime as needed for anxiety.  . Multiple Vitamins-Minerals (MULTIVITAMIN ADULTS PO) Take by mouth.  . pravastatin (PRAVACHOL) 40 MG tablet TAKE 1 TABLET BY MOUTH EVERY DAY  . traMADol (ULTRAM) 50 MG tablet Take 1 tablet (50 mg total) by mouth 3 (three) times daily. (Patient taking differently: Take 50 mg by mouth 3 (three) times daily as needed. )    Review of Systems  Constitutional: Negative for chills, fatigue and fever.  Respiratory: Negative for cough, chest tightness, shortness of breath and wheezing.   Cardiovascular: Negative for chest pain, palpitations and leg swelling.    Objective:  LMP 11/24/2019 (Approximate)       BP Readings from Last 3 Encounters:  05/21/19 112/80  02/19/19 138/70  06/27/12 123/71   Wt Readings from Last 3 Encounters:  05/21/19 162 lb 9.6 oz (73.8 kg)  02/19/19 161 lb 12.8 oz (73.4 kg)    EXAM:  GENERAL: alert, oriented, appears well and in no acute distress  HEENT: atraumatic, conjunctiva clear, no obvious abnormalities on inspection of external nose and ears  NECK: normal movements of the head and neck  LUNGS: on inspection no signs of respiratory distress, breathing rate appears normal, no obvious gross SOB, gasping or wheezing  CV: no obvious cyanosis  MS: moves all visible extremities without noticeable abnormality  PSYCH/NEURO: pleasant and cooperative, no obvious  depression or anxiety, speech and thought processing grossly intact. She is more calm during visit today.    Assessment/Plan  1. Anxiety She has done well with the  Celexa. Continue at current dose.   2. Grief reaction She still has a lot to go through dealing with grief process, but she is handling stress better with medication. Continue citalopram at current dosing.   Return in about 3 months (around 03/09/2020).   I discussed the assessment and  treatment plan with the patient. The patient was provided an opportunity to ask questions and all were answered. The patient agreed with the plan and demonstrated an understanding of the instructions.   The patient was advised to call back or seek an in-person evaluation if the symptoms worsen or if the condition fails to improve as anticipated.  I provided 15 minutes of non-face-to-face time during this encounter.   Theodis Shove, MD

## 2019-12-13 ENCOUNTER — Other Ambulatory Visit: Payer: Self-pay | Admitting: Family Medicine

## 2019-12-13 ENCOUNTER — Telehealth: Payer: Self-pay | Admitting: *Deleted

## 2019-12-13 DIAGNOSIS — M5441 Lumbago with sciatica, right side: Secondary | ICD-10-CM

## 2019-12-13 DIAGNOSIS — G8929 Other chronic pain: Secondary | ICD-10-CM

## 2019-12-13 DIAGNOSIS — M79641 Pain in right hand: Secondary | ICD-10-CM

## 2019-12-13 MED ORDER — TRAMADOL HCL 50 MG PO TABS: 50.0000 mg | ORAL_TABLET | Freq: Three times a day (TID) | ORAL | 0 refills | Status: DC | PRN
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
## 2019-12-13 NOTE — Telephone Encounter (Signed)
Patient called wanting to talk to the nurse regarding her back. Patient states she is waiting to get an appointment for injections, patient wants to know at this time can something be called in until she can get an appointment. Patient states she took her heart monitor off a week early due to causing blisters. Patient states she is returning it today.

## 2019-12-16 ENCOUNTER — Telehealth: Payer: Self-pay | Admitting: *Deleted

## 2019-12-16 NOTE — Telephone Encounter (Signed)
-----   Message from Wynn Banker, MD sent at 12/08/2019  1:18 PM EDT ----- Please set up physical in office for 3 mo time.

## 2019-12-16 NOTE — Telephone Encounter (Signed)
Spoke with the pt and scheduled an appt as below.  Also scheduled appt on 11/22 per pts request for back and shoulder pain.

## 2020-01-03 ENCOUNTER — Ambulatory Visit (INDEPENDENT_AMBULATORY_CARE_PROVIDER_SITE_OTHER): Payer: 59 | Admitting: Family Medicine

## 2020-01-03 ENCOUNTER — Encounter: Payer: Self-pay | Admitting: Family Medicine

## 2020-01-03 ENCOUNTER — Ambulatory Visit (INDEPENDENT_AMBULATORY_CARE_PROVIDER_SITE_OTHER): Payer: 59

## 2020-01-03 ENCOUNTER — Other Ambulatory Visit: Payer: Self-pay

## 2020-01-03 ENCOUNTER — Telehealth: Payer: Self-pay | Admitting: Family Medicine

## 2020-01-03 VITALS — BP 120/78 | HR 83 | Temp 98.5°F | Ht 63.0 in | Wt 158.6 lb

## 2020-01-03 DIAGNOSIS — G8929 Other chronic pain: Secondary | ICD-10-CM

## 2020-01-03 DIAGNOSIS — M542 Cervicalgia: Secondary | ICD-10-CM

## 2020-01-03 DIAGNOSIS — M549 Dorsalgia, unspecified: Secondary | ICD-10-CM | POA: Diagnosis not present

## 2020-01-03 DIAGNOSIS — M5441 Lumbago with sciatica, right side: Secondary | ICD-10-CM

## 2020-01-03 DIAGNOSIS — F419 Anxiety disorder, unspecified: Secondary | ICD-10-CM | POA: Diagnosis not present

## 2020-01-03 MED ORDER — DIAZEPAM 5 MG PO TABS: 2.5000 mg | ORAL_TABLET | Freq: Two times a day (BID) | ORAL | 1 refills | Status: DC | PRN

## 2020-01-03 MED ORDER — LIDOCAINE 5 % EX OINT: TOPICAL_OINTMENT | CUTANEOUS | 2 refills | Status: DC
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
## 2020-01-03 NOTE — Telephone Encounter (Signed)
Message received at the number below stating the call cannot be completed at this time.

## 2020-01-03 NOTE — Patient Instructions (Signed)
[facility]: [address] [place], [place] [address]  [phone]

## 2020-01-03 NOTE — Telephone Encounter (Signed)
Patient is calling and wanted to see if she can get a copy of her xray for her orthopedic appointment tomorrow, please advise. CB is [phone]

## 2020-03-07 ENCOUNTER — Other Ambulatory Visit: Payer: Self-pay | Admitting: Family Medicine

## 2020-03-07 ENCOUNTER — Telehealth: Payer: Self-pay | Admitting: Family Medicine

## 2020-03-07 DIAGNOSIS — M79641 Pain in right hand: Secondary | ICD-10-CM

## 2020-03-07 DIAGNOSIS — F419 Anxiety disorder, unspecified: Secondary | ICD-10-CM

## 2020-03-07 DIAGNOSIS — M5441 Lumbago with sciatica, right side: Secondary | ICD-10-CM

## 2020-03-07 DIAGNOSIS — G8929 Other chronic pain: Secondary | ICD-10-CM

## 2020-03-07 MED ORDER — DIAZEPAM 5 MG PO TABS: 2.5000 mg | ORAL_TABLET | Freq: Two times a day (BID) | ORAL | 1 refills | Status: DC | PRN

## 2020-03-07 MED ORDER — TRAMADOL HCL 50 MG PO TABS: 50.0000 mg | ORAL_TABLET | Freq: Three times a day (TID) | ORAL | 0 refills | Status: DC | PRN

## 2020-03-07 NOTE — Telephone Encounter (Signed)
  diazepam (VALIUM) 5 MG tablet  traMADol (ULTRAM) 50 MG tablet  [id number] - [place], [place] - [address] AT [facility] [address] Phone:  [phone]  Fax:  [phone]

## 2020-03-07 NOTE — Telephone Encounter (Signed)
Refills sent

## 2020-03-30 ENCOUNTER — Other Ambulatory Visit: Payer: Self-pay

## 2020-03-31 ENCOUNTER — Ambulatory Visit (INDEPENDENT_AMBULATORY_CARE_PROVIDER_SITE_OTHER): Payer: 59 | Admitting: Family Medicine

## 2020-03-31 ENCOUNTER — Encounter: Payer: Self-pay | Admitting: Family Medicine

## 2020-03-31 VITALS — BP 118/82 | HR 87 | Temp 98.7°F | Ht 63.5 in | Wt 163.6 lb

## 2020-03-31 DIAGNOSIS — M5441 Lumbago with sciatica, right side: Secondary | ICD-10-CM

## 2020-03-31 DIAGNOSIS — Z0000 Encounter for general adult medical examination without abnormal findings: Secondary | ICD-10-CM

## 2020-03-31 DIAGNOSIS — F419 Anxiety disorder, unspecified: Secondary | ICD-10-CM | POA: Diagnosis not present

## 2020-03-31 DIAGNOSIS — M79641 Pain in right hand: Secondary | ICD-10-CM | POA: Diagnosis not present

## 2020-03-31 DIAGNOSIS — E785 Hyperlipidemia, unspecified: Secondary | ICD-10-CM

## 2020-03-31 DIAGNOSIS — Z01818 Encounter for other preprocedural examination: Secondary | ICD-10-CM

## 2020-03-31 DIAGNOSIS — N926 Irregular menstruation, unspecified: Secondary | ICD-10-CM

## 2020-03-31 DIAGNOSIS — G8929 Other chronic pain: Secondary | ICD-10-CM

## 2020-03-31 MED ORDER — DIAZEPAM 5 MG PO TABS: 5.0000 mg | ORAL_TABLET | Freq: Two times a day (BID) | ORAL | 1 refills | Status: DC | PRN

## 2020-03-31 MED ORDER — TRAMADOL HCL 50 MG PO TABS: 50.0000 mg | ORAL_TABLET | Freq: Three times a day (TID) | ORAL | 0 refills | Status: DC | PRN

## 2020-03-31 NOTE — Progress Notes (Signed)
Wendy Deleon DOB: 07/06/1970 Encounter date: 03/31/2020  This is a 50 y.o. female who presents for complete physical   History of present illness/Additional concerns:  Got call from ortho - either has ruptured or shattered L5 disc; so she will see surgeon on 3/7. Back hurts. Past two days is first time she has walked without cane in a month. 3 mo ago she was given prednisone taper; did improve while on taper and about a week later. But if she stands up too quickly from bed/chair she gets sharp pains up right leg and gives out/falls. She was seeing Dr. Ethelene Halamos but will be seeing Dr. Shon BatonBrooks. All started 2 septembers ago when lifting trash can up onto trailer. Had done shots in back, but when mother in law was in hospice care in her house all the lifting flared. Neck issues are better. Most of time taking 800mg  ibuprofen; tramadol when severe. Taking diazepam nightly because if she doesn't she can't get up. She does usually wake up around 3-4am, but this is pretty chronic for her. Does nap during the day. Has rarely taken diazepam during day - if very bad morning.   She is extremely anxious about having/needing surgery.   Does feel that citalopram has helped with anxiety.   She had quit smoking when she was feeling well prior to back issues. She isn't ready to quit yet, but we did discuss benefit of quitting smoking esp prior to surgery.   HL: pravastatin 40mg  daily. Seems to be tolerating this well.   Follows with gyn Dr. Aldona BarWein - she had to miss some appointments last year due to caring for mother in law.  Last mammogram 12/2018; she will call and set this up as well Last pap: she is going to set up appointment for this  Past Medical History:  Diagnosis Date  . Allergy   . Arthritis   . History of chicken pox    Past Surgical History:  Procedure Laterality Date  . HAND SURGERY Right    2014, 2016  . lumbar     DDD-treated by Dr Ethelene Halamos   Allergies  Allergen Reactions  . Eggs  Or Egg-Derived Products Other (See Comments)    Vomiting, stomach swelling and diarrhea  . Flexeril [Cyclobenzaprine]     Tongue swelling, throat burning, "feels weird to breathe"  . Hydrocodone     migraine  . Oxycodone     Feels over-medicated  . Tizanidine     "throat feels weird"   Current Meds  Medication Sig  . Cetirizine HCl (ZYRTEC PO) Take by mouth.  . citalopram (CELEXA) 10 MG tablet Take 1 tablet (10 mg total) by mouth daily.  Marland Kitchen. ibuprofen (ADVIL) 200 MG tablet Take 800 mg by mouth as needed.  . Multiple Vitamins-Minerals (MULTIVITAMIN ADULTS PO) Take by mouth.  . pravastatin (PRAVACHOL) 40 MG tablet TAKE 1 TABLET BY MOUTH EVERY DAY  . [DISCONTINUED] diazepam (VALIUM) 5 MG tablet Take 0.5-1 tablets (2.5-5 mg total) by mouth every 12 (twelve) hours as needed for muscle spasms. (Patient taking differently: Take 5 mg by mouth every 12 (twelve) hours as needed for muscle spasms.)  . [DISCONTINUED] traMADol (ULTRAM) 50 MG tablet Take 1 tablet (50 mg total) by mouth 3 (three) times daily as needed for severe pain.   Social History   Tobacco Use  . Smoking status: Current Every Day Smoker    Packs/day: 1.00    Types: Cigarettes  . Smokeless tobacco: Former Arts development officerUser  Substance Use  Topics  . Alcohol use: No   Family History  Problem Relation Age of Onset  . Arthritis Mother   . Diabetes Mother   . Hypertension Mother   . Kidney disease Mother   . Miscarriages / [place] Mother   . Arthritis Father   . Hearing loss Father   . High Cholesterol Father   . Hypertension Father   . Alcohol abuse Sister   . Arthritis Maternal Grandmother   . Hearing loss Maternal Grandmother   . Heart attack Maternal Grandmother   . High blood pressure Maternal Grandmother   . Kidney disease Maternal Grandmother   . Stroke Maternal Grandmother 80  . Arthritis Maternal Grandfather   . Hearing loss Maternal Grandfather   . Heart attack Maternal Grandfather   . Heart disease Maternal  Grandfather   . High blood pressure Maternal Grandfather   . Arthritis/Rheumatoid Paternal Grandmother   . Hearing loss Paternal Grandmother   . Heart attack Paternal Grandmother   . Heart disease Paternal Grandmother   . Kidney disease Paternal Grandmother   . Other Paternal Grandfather        no medical history known  . Asthma Daughter   . Migraines Daughter   . Learning disabilities Daughter      Review of Systems  Constitutional: Negative for activity change, appetite change, chills, fatigue, fever and unexpected weight change.  HENT: Negative for congestion, ear pain, hearing loss, sinus pressure, sinus pain, sore throat and trouble swallowing.   Eyes: Negative for pain and visual disturbance.  Respiratory: Negative for cough, chest tightness, shortness of breath and wheezing.   Cardiovascular: Negative for chest pain, palpitations and leg swelling.  Gastrointestinal: Negative for abdominal pain, blood in stool, constipation, diarrhea, nausea and vomiting.  Genitourinary: Negative for difficulty urinating and menstrual problem.  Musculoskeletal: Positive for back pain. Negative for arthralgias.  Skin: Negative for rash.  Neurological: Negative for dizziness, weakness, numbness and headaches.  Hematological: Negative for adenopathy. Does not bruise/bleed easily.  Psychiatric/Behavioral: Negative for sleep disturbance and suicidal ideas. The patient is nervous/anxious.     CBC:  Lab Results  Component Value Date   WBC 10.2 02/19/2019   HGB 14.3 02/19/2019   HCT 42.3 02/19/2019   MCH 31.3 06/27/2012   MCHC 33.8 02/19/2019   RDW 13.2 02/19/2019   PLT 223.0 02/19/2019   CMP: Lab Results  Component Value Date   NA 137 02/19/2019   K 4.2 02/19/2019   CL 103 02/19/2019   CO2 25 02/19/2019   GLUCOSE 78 02/19/2019   BUN 21 02/19/2019   CREATININE 0.60 02/19/2019   GFRAA >90 06/27/2012   CALCIUM 10.0 02/19/2019   PROT 7.1 02/19/2019   BILITOT 0.5 02/19/2019   ALKPHOS  112 02/19/2019   ALT 13 02/19/2019   AST 13 02/19/2019   LIPID: Lab Results  Component Value Date   CHOL 241 (H) 05/21/2019   TRIG 95 05/21/2019   HDL 62 05/21/2019   LDLCALC 159 (H) 05/21/2019    Objective:  BP 118/82 (BP Location: Left Arm, Patient Position: Sitting, Cuff Size: Normal)   Pulse 87   Temp 98.7 F (37.1 C) (Oral)   Ht 5' 3.5" (1.613 m)   Wt 163 lb 9.6 oz (74.2 kg)   LMP 03/24/2020 (Exact Date)   SpO2 99%   BMI 28.53 kg/m   Weight: 163 lb 9.6 oz (74.2 kg)   BP Readings from Last 3 Encounters:  03/31/20 118/82  01/03/20 120/78  05/21/19 112/80  Wt Readings from Last 3 Encounters:  03/31/20 163 lb 9.6 oz (74.2 kg)  01/03/20 158 lb 9.6 oz (71.9 kg)  05/21/19 162 lb 9.6 oz (73.8 kg)    Physical Exam Constitutional:      General: She is not in acute distress.    Appearance: She is well-developed and well-nourished.  HENT:     Head: Normocephalic and atraumatic.     Right Ear: External ear normal.     Left Ear: External ear normal.     Mouth/Throat:     Mouth: Oropharynx is clear and moist.     Pharynx: No oropharyngeal exudate.  Eyes:     Conjunctiva/sclera: Conjunctivae normal.     Pupils: Pupils are equal, round, and reactive to light.  Neck:     Thyroid: No thyromegaly.  Cardiovascular:     Rate and Rhythm: Normal rate and regular rhythm.     Heart sounds: Normal heart sounds. No murmur heard. No friction rub. No gallop.   Pulmonary:     Effort: Pulmonary effort is normal.     Breath sounds: Normal breath sounds.  Abdominal:     General: Bowel sounds are normal. There is no distension.     Palpations: Abdomen is soft. There is no mass.     Tenderness: There is no abdominal tenderness. There is no guarding.     Hernia: No hernia is present.  Musculoskeletal:        General: No tenderness, deformity or edema. Normal range of motion.     Cervical back: Normal range of motion and neck supple.  Lymphadenopathy:     Cervical: No cervical  adenopathy.  Skin:    General: Skin is warm and dry.     Findings: No rash.     Comments: Left temple 25mm pink papule; left arm fibroma upper arm.   Neurological:     Mental Status: She is alert and oriented to person, place, and time.     Deep Tendon Reflexes: Strength normal. Reflexes normal.     Reflex Scores:      Tricep reflexes are 2+ on the right side and 2+ on the left side.      Bicep reflexes are 2+ on the right side and 2+ on the left side.      Brachioradialis reflexes are 2+ on the right side and 2+ on the left side.      Patellar reflexes are 2+ on the right side and 2+ on the left side. Psychiatric:        Mood and Affect: Mood and affect normal.        Speech: Speech normal.        Behavior: Behavior normal.        Thought Content: Thought content normal.     Assessment/Plan: Health Maintenance Due  Topic Date Due  . COLONOSCOPY (Pts 45-38yrs Insurance coverage will need to be confirmed)  Never done   Health Maintenance reviewed.  1. Preventative health care We discussed quitting smoking. She knows she needs to quit smoking; just has to get her mind around it.   2. Anxiety Has been better on citalopram.  - diazepam (VALIUM) 5 MG tablet; Take 1 tablet (5 mg total) by mouth every 12 (twelve) hours as needed for muscle spasms.  Dispense: 60 tablet; Refill: 1  3. Chronic low back pain with right-sided sciatica, unspecified back pain laterality She is limiting use.  - traMADol (ULTRAM) 50 MG tablet; Take 1 tablet (50 mg  total) by mouth 3 (three) times daily as needed for severe pain.  Dispense: 90 tablet; Refill: 0  4. Chronic pain of right hand Stable. - traMADol (ULTRAM) 50 MG tablet; Take 1 tablet (50 mg total) by mouth 3 (three) times daily as needed for severe pain.  Dispense: 90 tablet; Refill: 0  5. Preoperative testing Will set this up once she gets surgery date. - Comprehensive metabolic panel; Future - Protime-INR; Future - Urinalysis; Future  6.  Irregular periods Will follow with gyn - CBC with Differential/Platelet; Future - TSH; Future - Ferritin; Future  7. Hyperlipidemia, unspecified hyperlipidemia type Tolerating well prvastatin 40mg ; will recheck - Lipid panel; Future  Return in about 6 months (around 09/28/2020).  09/30/2020, MD

## 2020-03-31 NOTE — Patient Instructions (Addendum)
Please have ortho send me copy of MRI report/note at your follow up visit.   I have ordered routine bloodwork as well as those labs which we typically do pre-operatively. Just let me know when surgery is/surgery date and we can set up lab visit.   Set up dermatology and gynecology visit

## 2020-04-05 ENCOUNTER — Other Ambulatory Visit: Payer: Self-pay | Admitting: Obstetrics & Gynecology

## 2020-04-05 DIAGNOSIS — Z1231 Encounter for screening mammogram for malignant neoplasm of breast: Secondary | ICD-10-CM

## 2020-04-07 ENCOUNTER — Telehealth: Payer: Self-pay | Admitting: Family Medicine

## 2020-04-07 NOTE — Telephone Encounter (Signed)
Pt is calling in stating that the pharmacy has been trying to get in touch with [place] and since they have not reached [place] the have cancelled her medications and would like to see if someone from the office can check on this for her and give her a call back.

## 2020-04-07 NOTE — Telephone Encounter (Signed)
Left a message requesting the pt call back with the specific name of the medications she is referring to.

## 2020-04-07 NOTE — Telephone Encounter (Signed)
diazepam (VALIUM) 5 MG tablet  traMADol (ULTRAM) 50 MG tablet  [facility], [place] - [address] [place], [address]  [address] [place], [address], [place] [address]  Phone:  [phone] Fax:  [phone]    The patient said that the pharmacy has been trying to contact the office and have not reached our office so they canceled her order.  Please advise

## 2020-04-07 NOTE — Telephone Encounter (Signed)
I haven't heard from [employer] and both rx were sent on 2/18. Not sure if they are having issue with my rx? Please call them and see if we can get this straightened out. Patient has enough supply to last through weekend and most of next week so that should last until they can send her order (assuming we work out whatever issue is happening)

## 2020-04-07 NOTE — Telephone Encounter (Signed)
Spoke with the pharmacist Renae Fickle at [facility].  Renae Fickle stated the Rx for Diazepam was sent out on 2/24 and the Rx for Ultram was awaiting approval due to patients allergy to Morphine, which Ultram is Morphine related. Renae Fickle stated he received the fax from Dr Hassan Rowan and the Rx will be sent out today.  Spoke with the pt and informed her of this also.

## 2020-05-02 ENCOUNTER — Other Ambulatory Visit: Payer: Self-pay | Admitting: Family Medicine

## 2020-05-19 ENCOUNTER — Other Ambulatory Visit: Payer: Self-pay

## 2020-05-19 ENCOUNTER — Ambulatory Visit (INDEPENDENT_AMBULATORY_CARE_PROVIDER_SITE_OTHER): Payer: 59 | Admitting: Specialist

## 2020-05-19 ENCOUNTER — Encounter: Payer: Self-pay | Admitting: Specialist

## 2020-05-19 ENCOUNTER — Ambulatory Visit (INDEPENDENT_AMBULATORY_CARE_PROVIDER_SITE_OTHER): Payer: 59

## 2020-05-19 VITALS — BP 156/85 | HR 89 | Ht 63.5 in | Wt 164.0 lb

## 2020-05-19 DIAGNOSIS — M5116 Intervertebral disc disorders with radiculopathy, lumbar region: Secondary | ICD-10-CM

## 2020-05-19 DIAGNOSIS — G8929 Other chronic pain: Secondary | ICD-10-CM

## 2020-05-19 DIAGNOSIS — M5441 Lumbago with sciatica, right side: Secondary | ICD-10-CM | POA: Diagnosis not present

## 2020-05-19 DIAGNOSIS — M5136 Other intervertebral disc degeneration, lumbar region: Secondary | ICD-10-CM | POA: Diagnosis not present

## 2020-05-19 DIAGNOSIS — M4316 Spondylolisthesis, lumbar region: Secondary | ICD-10-CM | POA: Diagnosis not present

## 2020-05-19 DIAGNOSIS — M51369 Other intervertebral disc degeneration, lumbar region without mention of lumbar back pain or lower extremity pain: Secondary | ICD-10-CM

## 2020-05-19 DIAGNOSIS — M5416 Radiculopathy, lumbar region: Secondary | ICD-10-CM

## 2020-05-19 NOTE — Patient Instructions (Addendum)
Plan: Avoid bending, stooping and avoid lifting weights greater than 10 lbs. Avoid prolong standing and walking. Order for a new walker with wheels. Surgery scheduling secretary Tivis Ringer, will call you in the next week to schedule for surgery.  Surgery recommended is a one level lumbar bilateral L5-S1 microdiscetomy this would be with the OR microscope. Take tramadol for for pain. Risk of surgery includes risk of infection 1 in 300 patients, bleeding less than 1/4% chance you would need a transfusion. Risk to the nerves is one in 10,000.  Expect improved walking and standing tolerance. Expect relief of leg pain but numbness may persist depending on the length and degree of pressure that has been present.

## 2020-05-19 NOTE — Progress Notes (Addendum)
Office Visit Note   Patient: Wendy Deleon           Date of Birth: 1970/06/11           MRN: [id number] Visit Date: 05/19/2020              Requested by: Wynn Banker, MD [address] [place],  [place] [address] PCP: Wynn Banker, MD   Assessment & Plan: Visit Diagnoses:  1. Chronic midline low back pain with right-sided sciatica   2. Herniation of lumbar intervertebral disc with radiculopathy   3. Degenerative disc disease, lumbar   4. Spondylolisthesis, lumbar region   5. Right lumbar radiculopathy   50 year old female with nearly 2 and 1/2 year history of intermittantly incapacitating back and right leg pain. She is unable to walk long distances and pain is worsened by flexion and standing and walking. Pain began acutely with lift in October 2020. She has had conservative management with repetative ESIs and use of  Tramadol and advil. Pain pattern is right sided back and radicular in L5 and S1 distribution. Recent MRI with  Central and right L5-S1 HNP with right S1 Nerve compression and left subarticular narrowing that is mild. She has a low grade minimal L4-5 spondylolisthesis without nerve compression or instability on flexion and extension.Clinically there is a positive bowstring and Popliteal compression test right side, suggesting neural  Tension, Right ankle jerk is diminished. As the injury was acute and she has persistent neural tension signs I would recommend right microdiscectomy L5-S1 with left lateral recess decompression L5-S1. Her MRI Does not show modic endplate changes at either L4-5 and L5-S1 and lateral flexion and extension radiographs suggest that the L4-5 spondylolisthesis is stable and minimal, not likely to be symptomatic. She  Has risks that she may require future surgery in the form of fusion but it does not appear to be necessary to  provide her with significant relief of her sciatica and back pain related to the right L5-S1  disc herniation. I think the chances for her having improvement in back and leg pain to near normal function is on the order of 80%. The patient's radicular pain crosses to L5 and S1 distributions which can be seen with lumbar innervation patterns and does not exclude this patient from consideration of discectomy at the L5-S1 level for relieving her  Nerve pain.   Plan: Avoid bending, stooping and avoid lifting weights greater than 10 lbs. Avoid prolong standing and walking. Order for a new walker with wheels. Surgery scheduling secretary Tivis Ringer, will call you in the next week to schedule for surgery.  Surgery recommended is a one level lumbar bilateral L5-S1 microdiscetomy this would be with the OR microscope. Take tramadol for for pain. Risk of surgery includes risk of infection 1 in 300 patients, bleeding less than 1/4% chance you would need a transfusion. Risk to the nerves is one in 10,000.  Expect improved walking and standing tolerance. Expect relief of leg pain but numbness may persist depending on the length and degree of pressure that has been present.  Follow-Up Instructions: Return in about 4 weeks (around 06/16/2020).   Orders:  Orders Placed This Encounter  Procedures  . XR Lumbar Spine 2-3 Views   No orders of the defined types were placed in this encounter.     Procedures: No procedures performed   Clinical Data: No additional findings.   Subjective: Chief Complaint  Patient presents with  . Lower  Back - Pain    50 year old female with history of back and right leg pain since an injury that occurred in October 2020. She was helping her husband to lift a heavy bucket of concrete in the yard and at that time  She had severe pain.  The pain she has is constant, today it is on a scale of 1-10 a five. At first severe pain and incapacitating. She had pain with standing and walking any movement  Bending , stooping or lifting. Washing laundry is painful and  doing shopping walking at the [facility] is enough to cause her to be unable to function. Pain is back but also radiates into the right posterior and lateral thigh, buttock and down below the knee into the foot on to the top of the right foot. Once in a while left leg pain but not often.    Review of Systems  Constitutional: Negative.   HENT: Negative.  Negative for congestion, dental problem, drooling, ear discharge, ear pain, facial swelling, hearing loss, mouth sores, nosebleeds, postnasal drip, rhinorrhea, sinus pressure, sinus pain, sneezing, sore throat, tinnitus, trouble swallowing and voice change.   Eyes: Negative.  Negative for photophobia, pain, discharge, redness, itching and visual disturbance.  Respiratory: Negative.  Negative for apnea, cough, choking, chest tightness, shortness of breath, wheezing and stridor.   Cardiovascular: Negative.  Negative for chest pain, palpitations and leg swelling.  Gastrointestinal: Negative.  Negative for abdominal distention, abdominal pain, anal bleeding, blood in stool, constipation, diarrhea, nausea, rectal pain and vomiting.  Endocrine: Negative.  Negative for cold intolerance, heat intolerance, polydipsia, polyphagia and polyuria.  Genitourinary: Negative.  Negative for decreased urine volume, difficulty urinating, dyspareunia, dysuria, enuresis, flank pain, frequency, genital sores, hematuria, menstrual problem, pelvic pain, urgency and vaginal bleeding.  Musculoskeletal: Positive for back pain and gait problem. Negative for arthralgias, joint swelling, myalgias, neck pain and neck stiffness.  Skin: Negative.  Negative for color change, pallor, rash and wound.  Allergic/Immunologic: Positive for environmental allergies.  Neurological: Positive for weakness and numbness. Negative for dizziness, tremors, seizures, syncope, facial asymmetry, speech difficulty, light-headedness and headaches.  Hematological: Negative.   Psychiatric/Behavioral: Negative.   Negative for agitation, behavioral problems, confusion, decreased concentration, dysphoric mood, hallucinations, self-injury, sleep disturbance and suicidal ideas. The patient is not nervous/anxious and is not hyperactive.      Objective: Vital Signs: BP (!) 156/85   Pulse 89   Ht 5' 3.5" (1.613 m)   Wt 164 lb (74.4 kg)   BMI 28.60 kg/m   Physical Exam Constitutional:      General: She is not in acute distress.    Appearance: Normal appearance. She is normal weight. She is toxic-appearing. She is not ill-appearing or diaphoretic.  HENT:     Head: Normocephalic and atraumatic.     Nose: No congestion or rhinorrhea.     Mouth/Throat:     Pharynx: No oropharyngeal exudate or posterior oropharyngeal erythema.  Eyes:     General:        Right eye: No discharge.        Left eye: No discharge.  Pulmonary:     Effort: Pulmonary effort is normal.  Abdominal:     General: Abdomen is flat.  Musculoskeletal:     Lumbar back: Positive right straight leg raise test and positive left straight leg raise test.  Neurological:     Mental Status: She is alert.     Back Exam   Tenderness  The patient is  experiencing tenderness in the lumbar.  Range of Motion  Extension: abnormal  Flexion: normal  Lateral bend right: normal  Lateral bend left: normal  Rotation right: normal  Rotation left: normal   Tests  Straight leg raise right: positive Straight leg raise left: positive  Reflexes  Achilles: 1/4  Other  Toe walk: normal Heel walk: normal Sensation: decreased  Comments:  Right EHL and EDL are 4/5, Right ankle reflex is 1- c/w 2 on the left  Knee reflex 3/4 bilateral .       Specialty Comments:  No specialty comments available.  Imaging: XR Lumbar Spine 2-3 Views  Result Date: 05/19/2020 AP and lateral flexion and extension radiographs show minimal 56mm anterolisthesis L4-5 with mild disc narrowing not worsening with flexion or extension. There is a retrolisthesis  that also is minimal L5-S1  wth Disc narrowing that is mild. MRI is reviewed and this shows a central disc herniation with subarticular narrowing Right sided greater than left with right sided S1 nerve compression. The MRI does not demonstrate nerve compression at the L4-5 level.     PMFS History: Patient Active Problem List   Diagnosis Date Noted  . Chronic hand pain, right 02/20/2019  . Chronic low back pain 02/20/2019  . Hyperlipidemia 02/20/2019   Past Medical History:  Diagnosis Date  . Allergy   . Arthritis   . History of chicken pox     Family History  Problem Relation Age of Onset  . Arthritis Mother   . Diabetes Mother   . Hypertension Mother   . Kidney disease Mother   . Miscarriages / [place] Mother   . Arthritis Father   . Hearing loss Father   . High Cholesterol Father   . Hypertension Father   . Alcohol abuse Sister   . Arthritis Maternal Grandmother   . Hearing loss Maternal Grandmother   . Heart attack Maternal Grandmother   . High blood pressure Maternal Grandmother   . Kidney disease Maternal Grandmother   . Stroke Maternal Grandmother 80  . Arthritis Maternal Grandfather   . Hearing loss Maternal Grandfather   . Heart attack Maternal Grandfather   . Heart disease Maternal Grandfather   . High blood pressure Maternal Grandfather   . Arthritis/Rheumatoid Paternal Grandmother   . Hearing loss Paternal Grandmother   . Heart attack Paternal Grandmother   . Heart disease Paternal Grandmother   . Kidney disease Paternal Grandmother   . Other Paternal Grandfather        no medical history known  . Asthma Daughter   . Migraines Daughter   . Learning disabilities Daughter     Past Surgical History:  Procedure Laterality Date  . HAND SURGERY Right    2014, 2016  . lumbar     DDD-treated by Dr Ethelene Hal   Social History   Occupational History  . Not on file  Tobacco Use  . Smoking status: Current Every Day Smoker    Packs/day: 1.00    Types:  Cigarettes  . Smokeless tobacco: Former Engineer, water and Sexual Activity  . Alcohol use: No  . Drug use: Not on file  . Sexual activity: Yes    Partners: Male    Comment: husband with vasectomy

## 2020-05-25 ENCOUNTER — Ambulatory Visit
Admission: RE | Admit: 2020-05-25 | Discharge: 2020-05-25 | Disposition: A | Discharge status: Home / Self Care | Payer: 59 | Source: Ambulatory Visit | Attending: Obstetrics & Gynecology | Admitting: Obstetrics & Gynecology

## 2020-05-25 ENCOUNTER — Telehealth: Payer: Self-pay | Admitting: Family Medicine

## 2020-05-25 ENCOUNTER — Other Ambulatory Visit: Payer: Self-pay

## 2020-05-25 DIAGNOSIS — Z1231 Encounter for screening mammogram for malignant neoplasm of breast: Secondary | ICD-10-CM

## 2020-05-25 NOTE — Telephone Encounter (Signed)
Spoke with the pt and informed her of the message below.  Patient stated she has an appt with the physicians assistant prior to the  surgery and will call back if needed.

## 2020-05-25 NOTE — Telephone Encounter (Signed)
Patient Is calling to advise that she is scheduled to have back surgery on 06/16/20.  FYI

## 2020-05-25 NOTE — Telephone Encounter (Signed)
I have ordered her bloodwork to complete prior to surgery if she is not doing this through her specialist. She can set up lab appointment a couple of weeks prior to surgery and I am happy to complete preop paperwork if she has this.

## 2020-05-30 ENCOUNTER — Other Ambulatory Visit: Payer: Self-pay

## 2020-06-04 ENCOUNTER — Other Ambulatory Visit: Payer: Self-pay | Admitting: Family Medicine

## 2020-06-05 LAB — HM PAP SMEAR

## 2020-06-07 ENCOUNTER — Ambulatory Visit (INDEPENDENT_AMBULATORY_CARE_PROVIDER_SITE_OTHER): Payer: 59 | Admitting: Surgery

## 2020-06-07 ENCOUNTER — Encounter: Payer: Self-pay | Admitting: Surgery

## 2020-06-07 VITALS — BP 127/82 | HR 73 | Ht 63.0 in | Wt 164.0 lb

## 2020-06-07 DIAGNOSIS — M5116 Intervertebral disc disorders with radiculopathy, lumbar region: Secondary | ICD-10-CM

## 2020-06-07 NOTE — Progress Notes (Signed)
50 year old female history of L5-S1 HNP comes in for preop evaluation.  States that she continues have ongoing low back pain and right lower extremity radiculopathy with weakness.  She is want to proceed with L5-S1 BILATERAL PARTIAL HEMILAMINECTOMIES WITH RESECTION OF DISC RIGHT L5-S1, EXPLORE DISC LEFT SIDE.  Today history and physical performed.  Review of systems negative.  Review of systems positive for chronic dizziness due to left inner ear issue.  Denies cardiac pulmonary GI GU issues.  Surgical procedure discussed along with postop restrictions.  All questions answered.

## 2020-06-08 ENCOUNTER — Encounter: Payer: Self-pay | Admitting: Family Medicine

## 2020-06-13 NOTE — Progress Notes (Signed)
Surgical Instructions    Your procedure is scheduled on Friday, May 6th, 2022.  Report to [facility] Main Entrance "A" at 1030 A.M., then check in with the Admitting office.  Call this number if you have problems the morning of surgery:  [phone]   If you have any questions prior to your surgery date call [phone]: Open Monday-Friday 8am-4pm    Remember:  Do not eat after midnight the night before your surgery  You may drink clear liquids until 09:30 A.M. the morning of your surgery.   Clear liquids allowed are: Water, Non-Citrus Juices (without pulp), Carbonated Beverages, Clear Tea, Black Coffee Only, and Gatorade   Patient Instructions  . The night before surgery:  o No food after midnight. ONLY clear liquids after midnight  . The day of surgery (if you do NOT have diabetes):  o Drink ONE (1) Pre-Surgery Clear Ensure by 09:30 A.M. the morning of surgery. Drink in one sitting. Do not sip.  o This drink was given to you during your hospital  pre-op appointment visit.  o Nothing else to drink after completing the  Pre-Surgery Clear Ensure.         If you have questions, please contact your surgeon's office.   Take these medicines the morning of surgery with A SIP OF WATER   cetirizine (ZYRTEC) citalopram (CELEXA) pravastatin (PRAVACHOL)  If needed:  simethicone (GAS-X EXTRA STRENGTH) traMADol (ULTRAM)  As of today, STOP taking any Aspirin (unless otherwise instructed by your surgeon) Aleve, Naproxen, Ibuprofen, Motrin, Advil, Goody's, BC's, all herbal medications, fish oil, and all vitamins.                     Do not wear jewelry, make up, or nail polish            Do not wear lotions, powders, perfumes, or deodorant.            Men may shave face and neck.            Do not bring valuables to the hospital.            [facility] is not responsible for any belongings or valuables.  Do NOT Smoke (Tobacco/Vaping) or drink Alcohol 24 hours prior to your  procedure If you use a CPAP at night, you may bring all equipment for your overnight stay.   Contacts, glasses, dentures or bridgework may not be worn into surgery, please bring cases for these belongings   For patients admitted to the hospital, discharge time will be determined by your treatment team.   Patients discharged the day of surgery will not be allowed to drive home, and someone needs to stay with them for 24 hours.    Special instructions:   [facility]- Preparing For Surgery  Before surgery, you can play an important role. Because skin is not sterile, your skin needs to be as free of germs as possible. You can reduce the number of germs on your skin by washing with CHG (chlorahexidine gluconate) Soap before surgery.  CHG is an antiseptic cleaner which kills germs and bonds with the skin to continue killing germs even after washing.    Oral Hygiene is also important to reduce your risk of infection.  Remember - BRUSH YOUR TEETH THE MORNING OF SURGERY WITH YOUR REGULAR TOOTHPASTE  Please do not use if you have an allergy to CHG or antibacterial soaps. If your skin becomes reddened/irritated stop using the CHG.  Do  not shave (including legs and underarms) for at least 48 hours prior to first CHG shower. It is OK to shave your face.  Please follow these instructions carefully.   1. Shower the NIGHT BEFORE SURGERY and the MORNING OF SURGERY  2. If you chose to wash your hair, wash your hair first as usual with your normal shampoo.  3. After you shampoo, rinse your hair and body thoroughly to remove the shampoo.  4. Use CHG Soap as you would any other liquid soap. You can apply CHG directly to the skin and wash gently with a scrungie or a clean washcloth.   5. Apply the CHG Soap to your body ONLY FROM THE NECK DOWN.  Do not use on open wounds or open sores. Avoid contact with your eyes, ears, mouth and genitals (private parts). Wash Face and genitals (private parts)  with your  normal soap.   6. Wash thoroughly, paying special attention to the area where your surgery will be performed.  7. Thoroughly rinse your body with warm water from the neck down.  8. DO NOT shower/wash with your normal soap after using and rinsing off the CHG Soap.  9. Pat yourself dry with a CLEAN TOWEL.  10. Wear CLEAN PAJAMAS to bed the night before surgery  11. Place CLEAN SHEETS on your bed the night before your surgery  12. DO NOT SLEEP WITH PETS.   Day of Surgery: Take a shower with CHG soap. Wear Clean/Comfortable clothing the morning of surgery Do not apply any deodorants/lotions.   Remember to brush your teeth WITH YOUR REGULAR TOOTHPASTE.   Please read over the following fact sheets that you were given.

## 2020-06-14 ENCOUNTER — Encounter (HOSPITAL_COMMUNITY)
Admission: RE | Admit: 2020-06-14 | Discharge: 2020-06-14 | Disposition: A | Discharge status: Home / Self Care | Payer: 59 | Source: Ambulatory Visit | Attending: Specialist | Admitting: Specialist

## 2020-06-14 ENCOUNTER — Other Ambulatory Visit: Payer: Self-pay

## 2020-06-14 ENCOUNTER — Encounter (HOSPITAL_COMMUNITY): Payer: Self-pay

## 2020-06-14 DIAGNOSIS — Z01818 Encounter for other preprocedural examination: Secondary | ICD-10-CM | POA: Diagnosis present

## 2020-06-14 DIAGNOSIS — Z20822 Contact with and (suspected) exposure to covid-19: Secondary | ICD-10-CM | POA: Insufficient documentation

## 2020-06-14 HISTORY — DX: Cardiac arrhythmia, unspecified: I49.9

## 2020-06-14 HISTORY — DX: Anxiety disorder, unspecified: F41.9

## 2020-06-14 LAB — CBC
HCT: 40.8 % (ref 36.0–46.0)
Hemoglobin: 13.6 g/dL (ref 12.0–15.0)
MCH: 32.3 pg (ref 26.0–34.0)
MCHC: 33.3 g/dL (ref 30.0–36.0)
MCV: 96.9 fL (ref 80.0–100.0)
Platelets: 247 10*3/uL (ref 150–400)
RBC: 4.21 MIL/uL (ref 3.87–5.11)
RDW: 11.7 % (ref 11.5–15.5)
WBC: 11.7 10*3/uL — ABNORMAL HIGH (ref 4.0–10.5)
nRBC: 0 % (ref 0.0–0.2)

## 2020-06-14 LAB — COMPREHENSIVE METABOLIC PANEL
ALT: 20 U/L (ref 0–44)
AST: 21 U/L (ref 15–41)
Albumin: 3.7 g/dL (ref 3.5–5.0)
Alkaline Phosphatase: 95 U/L (ref 38–126)
Anion gap: 8 (ref 5–15)
BUN: 12 mg/dL (ref 6–20)
CO2: 25 mmol/L (ref 22–32)
Calcium: 9.5 mg/dL (ref 8.9–10.3)
Chloride: 103 mmol/L (ref 98–111)
Creatinine, Ser: 0.57 mg/dL (ref 0.44–1.00)
GFR, Estimated: >60 mL/min (ref >60)
Glucose, Bld: 92 mg/dL (ref 70–99)
Potassium: 3.8 mmol/L (ref 3.5–5.1)
Sodium: 136 mmol/L (ref 135–145)
Total Bilirubin: 0.6 mg/dL (ref 0.3–1.2)
Total Protein: 6.8 g/dL (ref 6.5–8.1)

## 2020-06-14 LAB — SURGICAL PCR SCREEN
MRSA, PCR: NEGATIVE
Staphylococcus aureus: NEGATIVE

## 2020-06-14 LAB — PROTIME-INR
INR: 0.9 (ref 0.8–1.2)
Prothrombin Time: 12.6 seconds (ref 11.4–15.2)

## 2020-06-14 LAB — SARS CORONAVIRUS 2 (TAT 6-24 HRS): SARS Coronavirus 2: NEGATIVE

## 2020-06-14 NOTE — Progress Notes (Signed)
PCP - Theodis Shove Cardiologist - denies  PPM/ICD - denies   Chest x-ray - n/a EKG - 06/14/20 Stress Test - denies ECHO - denies Cardiac Cath - denies  Sleep Study - denies Sent staff message to Theodis Shove, MD about patient concerns about apnea episodes at night as well as excessive snoring. Pt does not wear CPAP  No diabetes  Patient instructed to hold all Aspirin, NSAID's, herbal medications, fish oil and vitamins 7 days prior to surgery.   ERAS Protcol -yes PRE-SURGERY Ensure or G2- ensure given  COVID TEST- 06/14/20   Anesthesia review: no  Patient denies shortness of breath, fever, cough and chest pain at PAT appointment   All instructions explained to the patient, with a verbal understanding of the material. Patient agrees to go over the instructions while at home for a better understanding. Patient also instructed to self quarantine after being tested for COVID-19. The opportunity to ask questions was provided.

## 2020-06-14 NOTE — Progress Notes (Signed)
PCP - Theodis Shove Cardiologist - denies  PPM/ICD - denies   Chest x-ray - n/a EKG - 06/14/20 Stress Test - denies ECHO - denies Cardiac Cath - denies  Sleep Study - denies Sent staff message to Theodis Shove, MD about patient concerns about apnea episodes at night as well as excessive snoring.   No diabetes  Patient instructed to hold all Aspirin, NSAID's, herbal medications, fish oil and vitamins 7 days prior to surgery.   ERAS Protcol -yes PRE-SURGERY Ensure or G2- ensure given  COVID TEST- 06/14/20   Anesthesia review: no  Patient denies shortness of breath, fever, cough and chest pain at PAT appointment   All instructions explained to the patient, with a verbal understanding of the material. Patient agrees to go over the instructions while at home for a better understanding. Patient also instructed to self quarantine after being tested for COVID-19. The opportunity to ask questions was provided.

## 2020-06-16 ENCOUNTER — Ambulatory Visit (HOSPITAL_COMMUNITY): Admission: RE | Admit: 2020-06-16 | Payer: 59 | Source: Home / Self Care | Admitting: Specialist

## 2020-06-16 ENCOUNTER — Encounter (HOSPITAL_COMMUNITY): Admission: RE | Payer: Self-pay | Source: Home / Self Care

## 2020-06-16 ENCOUNTER — Telehealth: Payer: Self-pay | Admitting: *Deleted

## 2020-06-16 DIAGNOSIS — G4730 Sleep apnea, unspecified: Secondary | ICD-10-CM

## 2020-06-16 SURGERY — LUMBAR LAMINECTOMY/DECOMPRESSION MICRODISCECTOMY
Anesthesia: General

## 2020-06-16 NOTE — Telephone Encounter (Signed)
Spoke with the pt and informed her of the message below.  Patient is aware the referral was placed and someone will call with appt info.

## 2020-06-16 NOTE — Telephone Encounter (Signed)
-----   Message from Wynn Banker, MD sent at 06/15/2020 11:38 AM EDT ----- Regarding: FW: sleep apnea risk Preop nurse mentioned patient snoring/stopping breathing during intake questioning. I do think it is worthwhile to refer for sleep evaluation for sleep apnea given this information. If patient is ok with referral, please place. Remind her that sleep apnea can impact heart and lung function and poor sleep affects weight, mood, blood pressure - really everything!  ----- Message ----- From: Amalia Greenhouse, RN Sent: 06/14/2020  10:36 AM EDT To: Wynn Banker, MD Subject: sleep apnea risk                               Good morning! During the PAT appointment for her upcoming back surgery, the patient has stated that she stops breathing in her sleep and snores excessively. She admitted to never bringing it up with you so I wanted to make sure you were aware so you could appropriately follow up. Thank you!  Kandice Hams, RN

## 2020-06-22 ENCOUNTER — Telehealth: Payer: Self-pay | Admitting: Specialist

## 2020-06-22 ENCOUNTER — Ambulatory Visit: Payer: PRIVATE HEALTH INSURANCE | Admitting: Specialist

## 2020-06-22 ENCOUNTER — Other Ambulatory Visit: Payer: Self-pay | Admitting: Radiology

## 2020-06-22 DIAGNOSIS — M5416 Radiculopathy, lumbar region: Secondary | ICD-10-CM

## 2020-06-22 DIAGNOSIS — M5116 Intervertebral disc disorders with radiculopathy, lumbar region: Secondary | ICD-10-CM

## 2020-06-22 DIAGNOSIS — M5136 Other intervertebral disc degeneration, lumbar region: Secondary | ICD-10-CM

## 2020-06-22 DIAGNOSIS — G8929 Other chronic pain: Secondary | ICD-10-CM

## 2020-06-22 DIAGNOSIS — M4316 Spondylolisthesis, lumbar region: Secondary | ICD-10-CM

## 2020-06-22 NOTE — Telephone Encounter (Signed)
Order placed for PT 

## 2020-06-22 NOTE — Telephone Encounter (Signed)
Patient called asked when can she have her surgery rescheduled? Patient asked if the denial letter has been received? Patient said she fell on 06/20/2020 and landed on her knees and hands. Patient asked for a call back as soon as possible. The number to contact patient is [phone]

## 2020-06-22 NOTE — Telephone Encounter (Signed)
Patient states that she gets severe pain up the back. And she will be walking along and all of the sudden she is on the ground. She is willing to do PT she would like to do it in our office.

## 2020-06-22 NOTE — Telephone Encounter (Signed)
Her surgery was denied because she had not had physical therapy for 6 weeks.  I assume this needs to happen before surgery will be approved.

## 2020-06-29 ENCOUNTER — Encounter: Payer: PRIVATE HEALTH INSURANCE | Admitting: Surgery

## 2020-07-04 ENCOUNTER — Ambulatory Visit: Payer: No Typology Code available for payment source | Admitting: Physical Therapy

## 2020-07-04 ENCOUNTER — Other Ambulatory Visit: Payer: Self-pay

## 2020-07-04 ENCOUNTER — Encounter: Payer: Self-pay | Admitting: Physical Therapy

## 2020-07-04 DIAGNOSIS — R296 Repeated falls: Secondary | ICD-10-CM | POA: Diagnosis not present

## 2020-07-04 DIAGNOSIS — M5441 Lumbago with sciatica, right side: Secondary | ICD-10-CM

## 2020-07-04 DIAGNOSIS — M6281 Muscle weakness (generalized): Secondary | ICD-10-CM

## 2020-07-04 DIAGNOSIS — M5417 Radiculopathy, lumbosacral region: Secondary | ICD-10-CM

## 2020-07-04 DIAGNOSIS — G8929 Other chronic pain: Secondary | ICD-10-CM

## 2020-07-04 NOTE — Patient Instructions (Signed)
Access Code: [email or web address] URL: [email or web address] Date: 07/04/2020 Prepared by: Moshe Cipro  Exercises Right Standing Lateral Shift Correction at Wall - Repetitions - 5-6 x daily - 7 x weekly - 1 sets - 10 reps - 10 sec hold Prone on Elbows Stretch - 5-6 x daily - 7 x weekly - 1 sets - 1 reps - 3 min hold Prone Press Up On Elbows - 5-6 x daily - 7 x weekly - 1 sets - 10 reps

## 2020-07-04 NOTE — Therapy (Signed)
[facility] [address] [place], [place], [address] Phone: [phone]   Fax:  [phone]  Physical Therapy Evaluation  Patient Details  Name: Wendy Deleon MRN: [id number] Date of Birth: October 15, 1970 Referring Provider (PT): Kerrin Champagne, MD   Encounter Date: 07/04/2020   PT End of Session - 07/04/20 0843    Visit Number 1    Number of Visits 6    Date for PT Re-Evaluation 08/15/20    Authorization Type [employer]    PT Start Time 0800    PT Stop Time 0840    PT Time Calculation (min) 40 min    Activity Tolerance Patient tolerated treatment well    Behavior During Therapy [facility] for tasks assessed/performed           Past Medical History:  Diagnosis Date  . Allergy   . Anxiety   . Arthritis   . Dysrhythmia    "skips beats" per pcp.   Marland Kitchen History of chicken pox     Past Surgical History:  Procedure Laterality Date  . HAND SURGERY Right    2014, 2016  . lumbar     DDD-treated by Dr Ethelene Hal    There were no vitals filed for this visit.    Subjective Assessment - 07/04/20 0803    Subjective Pt is a 50 y/o female who presents to OPPT for LBP with MRI showing HNP at L5/S1 on MRI.  Pt was scheduled for discectomy but surgery was denied as she had not had 6 weeks of PT.  Initial injury in fall of 2020 when trying to help husband lift something heavy.  She has had multiple injections and reports multiple falls over the past 4 months.    Limitations Standing;Walking;Lifting;Sitting    How long can you stand comfortably? no more than 5 min    How long can you walk comfortably? up to 2 hours holding onto support    Diagnostic tests MRI: HNP L5/S1    Patient Stated Goals improve mobility and pain    Currently in Pain? Yes    Pain Score 10-Worst pain ever   at best 4/10   Pain Location Back    Pain Orientation Lower;Right    Pain Descriptors / Indicators Burning;Shooting;Stabbing   stinging   Pain Type Chronic pain    Pain  Radiating Towards mid calf on Rt, up to scapula    Pain Onset More than a month ago    Pain Frequency Constant    Aggravating Factors  walking, bending over    Pain Relieving Factors heat, meds              [facility] - 07/04/20 0808      Assessment   Medical Diagnosis M51.16 (ICD-10-CM) - Herniation of lumbar intervertebral disc with radiculopathy  M54.41,G89.29 (ICD-10-CM) - Chronic midline low back pain with right-sided sciatica  M51.36 (ICD-10-CM) - Degenerative disc disease, lumbar  M43.16 (ICD-10-CM) - Spondylolisthesis, lumbar region  M54.16 (ICD-10-CM) - Right lumbar radiculopathy    Referring Provider (PT) Kerrin Champagne, MD    Onset Date/Surgical Date --   Fall 2020   Hand Dominance Left    Next MD Visit PRN    Prior Therapy none      Precautions   Precautions Fall      Restrictions   Weight Bearing Restrictions No      Balance Screen   Has the patient fallen in the past 6 months Yes    How  many times? 6-7    Has the patient had a decrease in activity level because of a fear of falling?  Yes    Is the patient reluctant to leave their home because of a fear of falling?  Yes      Home Environment   Living Environment Private residence    Living Arrangements Spouse/significant other;Children   65, 39, 42 y/o children   Type of Home House    Home Access Level entry    Home Layout One level    Home Equipment [place] - single point;Walker - 2 wheels;Walker - 4 wheels      Prior Function   Level of Independence Independent    Vocation Works at home    Leisure work in yard, go to lake, no regular exercise prior to injury (has 4 dogs so lots of walking)      Cognition   Overall Cognitive Status Within Functional Limits for tasks assessed      Observation/Other Assessments   Focus on Therapeutic Outcomes (FOTO)  30 (predicted 45)      ROM / Strength   AROM / PROM / Strength AROM;Strength      AROM   AROM Assessment Site Lumbar    Lumbar Flexion limited  50%    Lumbar Extension deferred    Lumbar - Right Side Bend limited 25%    Lumbar - Left Side Bend WNL    Lumbar - Right Rotation limited 25%    Lumbar - Left Rotation WNL      Strength   Strength Assessment Site Hip;Knee;Ankle    Right/Left Hip Right;Left    Right Hip Flexion 3/5    Left Hip Flexion 5/5    Right/Left Knee Right;Left    Right Knee Flexion 4/5    Right Knee Extension 4/5    Left Knee Flexion 5/5    Left Knee Extension 5/5    Right/Left Ankle Right;Left    Right Ankle Dorsiflexion 4/5    Left Ankle Dorsiflexion 4/5      Special Tests    Special Tests Lumbar    Lumbar Tests Slump Test      Slump test   Findings Positive    Side Right    Comment Lt mildly positive      Ambulation/Gait   Ambulation/Gait Yes    Ambulation/Gait Assistance 4: Min assist;4: Min guard    Assistive device Straight cane    Gait Pattern Decreased stance time - right;Decreased step length - left;Decreased weight shift to right;Antalgic    Gait Comments significant pain noted with amb; hx of multiple episodes of buckling so provided min A                      Objective measurements completed on examination: See above findings.       [facility] Adult PT Treatment/Exercise - 07/04/20 0808      Exercises   Exercises Lumbar      Lumbar Exercises: Stretches   Prone on Elbows Stretch 3 reps;60 seconds   continuous   Other Lumbar Stretch Exercise Rt lateral shift correction 5 x 10 sec - Rt knee buckled on 5th rep needing mod A to avoid fall                  PT Education - 07/04/20 0843    Education Details HEP, clinical findings, POC    Person(s) Educated Patient;Spouse    Methods Explanation;Demonstration;Handout    Comprehension Verbalized  understanding;Returned demonstration;Need further instruction            PT Short Term Goals - 07/04/20 1404      PT SHORT TERM GOAL #1   Title independent with initial HEP    Time 3    Period Weeks    Status New     Target Date 07/25/20             PT Long Term Goals - 07/04/20 1404      PT LONG TERM GOAL #1   Title independent with final HEP    Time 6    Period Weeks    Status New    Target Date 08/15/20      PT LONG TERM GOAL #2   Title FOTO score improved to 45 for improved function    Time 6    Period Weeks    Status New    Target Date 08/15/20      PT LONG TERM GOAL #3   Title demonstrate 5/5 RLE strength for improved function    Time 6    Period Weeks    Status New    Target Date 08/15/20      PT LONG TERM GOAL #4   Title report pain < 6/10 with amb and standing > 10 min for improved strength and activity tolerance    Time 6    Period Weeks    Status New    Target Date 08/15/20      PT LONG TERM GOAL #5   Title report no episodes of falls or buckling for improved mobility and stability    Time 6    Period Weeks    Status New    Target Date 08/15/20                  Plan - 07/04/20 1356    Clinical Impression Statement Pt is a 50 y/o female who presents to OPPT for LBP with known HNP at L5-S1 and pain x 18 months.  She reports continued pain and increased weakness which is noted with MMT today. She has had at least 6 falls over the past 4 months, and had a near fall today requiring mod A from PT after standing for < 5 min with UE support on wall.  She demonstrates positive slump test, decreased strength and ROM as well as continued pain elevated and rated 10/10 today with visible discomfort noted by PT.  Trial of extension based HEP for home to see if symptoms centralize, and may benefit from continued PT to address deficits listed.  Recommend review from insurance company for possbile surgical considerations.    Personal Factors and Comorbidities Comorbidity 3+;Past/Current Experience;Time since onset of injury/illness/exacerbation    Comorbidities anxiety, arthritis, HNP L5-S1    Examination-Activity Limitations Bathing;Sit;Bed  Mobility;Sleep;Bend;Squat;Stairs;Stand;Toileting;Transfers;Dressing;Hygiene/Grooming;Lift;Locomotion Level    Examination-Participation Restrictions Cleaning;Community Activity;Driving;Yard Work;Laundry;Meal Prep    Stability/Clinical Decision Making Unstable/Unpredictable    Clinical Decision Making High    Rehab Potential Good    PT Frequency 1x / week    PT Duration 6 weeks    PT Treatment/Interventions ADLs/Self Care Home Management;Aquatic Therapy;Cryotherapy;Electrical Stimulation;Moist Heat;Traction;Balance training;Therapeutic exercise;Therapeutic activities;Functional mobility training;Stair training;Gait training;DME Instruction;Ultrasound;Neuromuscular re-education;Patient/family education;Manual techniques;Taping;Energy conservation;Dry needling;Passive range of motion    PT Next Visit Plan review HEP, see how she's doing and progress HEP as able; core/hip/RLE strengthening    PT Home Exercise Plan Access Code: [email or web address]    Consulted and Agree with Plan of Care Patient  Patient will benefit from skilled therapeutic intervention in order to improve the following deficits and impairments:  Abnormal gait,Decreased strength,Pain,Difficulty walking,Decreased mobility,Decreased balance,Decreased range of motion,Postural dysfunction  Visit Diagnosis: Radiculopathy, lumbosacral region - Plan: PT plan of care cert/re-cert  Chronic right-sided low back pain with right-sided sciatica - Plan: PT plan of care cert/re-cert  Muscle weakness (generalized) - Plan: PT plan of care cert/re-cert  Repeated falls - Plan: PT plan of care cert/re-cert     Problem List Patient Active Problem List   Diagnosis Date Noted  . Chronic hand pain, right 02/20/2019  . Chronic low back pain 02/20/2019  . Hyperlipidemia 02/20/2019      Clarita Crane, PT, DPT 07/04/20 2:09 PM    [facility] Physical Therapy [address] [place], [place], [address] Phone:  [phone]   Fax:  [phone]  Name: Wendy Deleon MRN: [id number] Date of Birth: July 27, 1970

## 2020-07-05 ENCOUNTER — Telehealth: Payer: Self-pay | Admitting: Family Medicine

## 2020-07-05 ENCOUNTER — Other Ambulatory Visit: Payer: Self-pay | Admitting: Family Medicine

## 2020-07-05 DIAGNOSIS — F419 Anxiety disorder, unspecified: Secondary | ICD-10-CM

## 2020-07-05 MED ORDER — DIAZEPAM 5 MG PO TABS: 5.0000 mg | ORAL_TABLET | Freq: Two times a day (BID) | ORAL | 1 refills | Status: DC | PRN

## 2020-07-05 NOTE — Telephone Encounter (Signed)
Pt is calling in stating that she has changed insurance Administrator) and they are using [facility].  Pt is needing a refill on Rx diazepam VALIUM) 5 MG.

## 2020-07-05 NOTE — Telephone Encounter (Signed)
Noted  

## 2020-07-05 NOTE — Telephone Encounter (Signed)
Med sent to requested pharmacy 

## 2020-07-12 ENCOUNTER — Other Ambulatory Visit: Payer: Self-pay | Admitting: Family Medicine

## 2020-07-12 ENCOUNTER — Telehealth: Payer: Self-pay | Admitting: Family Medicine

## 2020-07-12 DIAGNOSIS — G8929 Other chronic pain: Secondary | ICD-10-CM

## 2020-07-12 DIAGNOSIS — M5441 Lumbago with sciatica, right side: Secondary | ICD-10-CM

## 2020-07-12 MED ORDER — TRAMADOL HCL 50 MG PO TABS: 50.0000 mg | ORAL_TABLET | Freq: Three times a day (TID) | ORAL | 0 refills | Status: DC | PRN

## 2020-07-12 NOTE — Telephone Encounter (Signed)
Patient is calling and requesting a refill for traMADol (ULTRAM) 50 MG tablet to be sent to  [id number] - [place], [place] - [address] AT [facility] [facility] [address]  [address], [place] [address]  Phone:  [phone] Fax:  [phone]  CB is [phone]

## 2020-07-14 ENCOUNTER — Encounter: Payer: Self-pay | Admitting: Rehabilitative and Restorative Service Providers"

## 2020-07-14 ENCOUNTER — Ambulatory Visit (INDEPENDENT_AMBULATORY_CARE_PROVIDER_SITE_OTHER): Payer: No Typology Code available for payment source | Admitting: Rehabilitative and Restorative Service Providers"

## 2020-07-14 ENCOUNTER — Other Ambulatory Visit: Payer: Self-pay

## 2020-07-14 DIAGNOSIS — M6281 Muscle weakness (generalized): Secondary | ICD-10-CM

## 2020-07-14 DIAGNOSIS — M5417 Radiculopathy, lumbosacral region: Secondary | ICD-10-CM | POA: Diagnosis not present

## 2020-07-14 DIAGNOSIS — R296 Repeated falls: Secondary | ICD-10-CM

## 2020-07-14 DIAGNOSIS — G8929 Other chronic pain: Secondary | ICD-10-CM

## 2020-07-14 DIAGNOSIS — M5441 Lumbago with sciatica, right side: Secondary | ICD-10-CM

## 2020-07-14 NOTE — Therapy (Addendum)
[facility] Physical Therapy [address] [place], [place], [address] Phone: [phone]   Fax:  [phone]  Physical Therapy Treatment  Patient Details  Name: Wendy Deleon MRN: [id number] Date of Birth: 03/15/1970 Referring Provider (PT): Kerrin Champagne, MD   Encounter Date: 07/14/2020   PT End of Session - 07/14/20 1303     Visit Number 2   Number of Visits 6    Date for PT Re-Evaluation 08/15/20    Authorization Type Aetna    PT Start Time 1301    PT Stop Time 1342    PT Time Calculation (min) 41 min    Activity Tolerance Patient limited by pain    Behavior During Therapy [facility] for tasks assessed/performed             Past Medical History:  Diagnosis Date   Allergy    Anxiety    Arthritis    Dysrhythmia    "skips beats" per pcp.    History of chicken pox     Past Surgical History:  Procedure Laterality Date   HAND SURGERY Right    2014, 2016   lumbar     DDD-treated by Dr Ethelene Hal    There were no vitals filed for this visit.   Subjective Assessment - 07/14/20 1303     Subjective Pt. indicated complaints of back and hip.  Pt. stated yesterday she could barely lift leg.  Had complaints of pain increase c lateral shift correction in back.    Limitations Standing;Walking;Lifting;Sitting    Diagnostic tests MRI: HNP L5/S1    Patient Stated Goals improve mobility and pain    Pain Score 6     Pain Location Back    Pain Orientation Right    Pain Radiating Towards back/hip    Pain Onset More than a month ago    Aggravating Factors  walking, bending, some pain c some of HEP    Pain Relieving Factors heat, meds                [facility] PT Assessment - 07/14/20 0001       Assessment   Medical Diagnosis M51.16 (ICD-10-CM) - Herniation of lumbar intervertebral disc with radiculopathy  M54.41,G89.29 (ICD-10-CM) - Chronic midline low back pain with right-sided sciatica  M51.36 (ICD-10-CM) - Degenerative disc disease, lumbar   M43.16 (ICD-10-CM) - Spondylolisthesis, lumbar region  M54.16 (ICD-10-CM) - Right lumbar radiculopathy    Referring Provider (PT) Kerrin Champagne, MD      AROM   Lumbar Extension 50% [facility] c ERP lumbar noted (no centralization or peripherialization noted  - absent of RLE symptoms during testing)      Palpation   Spinal mobility Severe tenderness/pain c G1-G2 assessment of L3, L4, L5 (localized symptoms only)                           [facility] Adult PT Treatment/Exercise - 07/14/20 0001       Self-Care   Self-Care Other Self-Care Comments    Other Self-Care Comments  Pt. education on use of HEP, timing of use of HEP c cues for possible reactions.  Discussed directional preference description and favorable response (reduced radicular symptoms).  Discussed how RLE symptoms and lumbar symptoms may be related but not caused specifically from same disc related problem i.e. lumbar symptoms from joint/myofascial limitations.  Detailed how reduced leg symptoms are favorable c movement in goals of centralization  Lumbar Exercises: Stretches   Lower Trunk Rotation 5 reps   15 seconds x 5 bilateral     Lumbar Exercises: Standing   Other Standing Lumbar Exercises lumbar extension standing x 5      Lumbar Exercises: Supine   Other Supine Lumbar Exercises supine glute set 5 sec hold x 10 in hooklying      Manual Therapy   Manual therapy comments g2 cPA L1, L2.  Manual lateral shift correction stretch trunk movement to Rt 2-3 second holds                    PT Education - 07/14/20 1342     Education Details Education on centralization/peripherization directional preference, see self care.    Person(s) Educated Patient    Methods Explanation;Verbal cues    Comprehension Verbalized understanding              PT Short Term Goals - 07/14/20 1342       PT SHORT TERM GOAL #1   Title independent with initial HEP    Time 3    Period Weeks    Status On-going     Target Date 07/25/20               PT Long Term Goals - 07/04/20 1404       PT LONG TERM GOAL #1   Title independent with final HEP    Time 6    Period Weeks    Status New    Target Date 08/15/20      PT LONG TERM GOAL #2   Title FOTO score improved to 45 for improved function    Time 6    Period Weeks    Status New    Target Date 08/15/20      PT LONG TERM GOAL #3   Title demonstrate 5/5 RLE strength for improved function    Time 6    Period Weeks    Status New    Target Date 08/15/20      PT LONG TERM GOAL #4   Title report pain < 6/10 with amb and standing > 10 min for improved strength and activity tolerance    Time 6    Period Weeks    Status New    Target Date 08/15/20      PT LONG TERM GOAL #5   Title report no episodes of falls or buckling for improved mobility and stability    Time 6    Period Weeks    Status New    Target Date 08/15/20                   Plan - 07/14/20 1342     Clinical Impression Statement Pt. continued to present c higher severity and irritability of lumbar symptoms today.  Overall lower complaints of Rt leg symptoms while in clinic today.  Reviewed HEP performance and education/cues on amount of movement to tolerance for low back symptoms.  Education given regarding goals of centralization presentation.  Pt. may continue to benefit from skilled PT services at this time.    Personal Factors and Comorbidities Comorbidity 3+;Past/Current Experience;Time since onset of injury/illness/exacerbation    Comorbidities anxiety, arthritis, HNP L5-S1    Examination-Activity Limitations Bathing;Sit;Bed Mobility;Sleep;Bend;Squat;Stairs;Stand;Toileting;Transfers;Dressing;Hygiene/Grooming;Lift;Locomotion Level    Examination-Participation Restrictions Cleaning;Community Activity;Driving;Yard Work;Laundry;Meal Prep    Stability/Clinical Decision Making Unstable/Unpredictable    Rehab Potential Good    PT Frequency 1x / week    PT  Duration 6 weeks    PT Treatment/Interventions ADLs/Self Care Home Management;Aquatic Therapy;Cryotherapy;Electrical Stimulation;Moist Heat;Traction;Balance training;Therapeutic exercise;Therapeutic activities;Functional mobility training;Stair training;Gait training;DME Instruction;Ultrasound;Neuromuscular re-education;Patient/family education;Manual techniques;Taping;Energy conservation;Dry needling;Passive range of motion    PT Next Visit Plan Reassess extension mobility/directional preference response.    PT Home Exercise Plan Access Code: [email or web address]    Consulted and Agree with Plan of Care Patient             Patient will benefit from skilled therapeutic intervention in order to improve the following deficits and impairments:  Abnormal gait,Decreased strength,Pain,Difficulty walking,Decreased mobility,Decreased balance,Decreased range of motion,Postural dysfunction  Visit Diagnosis: Radiculopathy, lumbosacral region  Chronic right-sided low back pain with right-sided sciatica  Muscle weakness (generalized)  Repeated falls     Problem List Patient Active Problem List   Diagnosis Date Noted   Chronic hand pain, right 02/20/2019   Chronic low back pain 02/20/2019   Hyperlipidemia 02/20/2019    Chyrel Masson, PT, DPT, OCS, ATC 07/14/20  1:46 PM    [place] [facility] Physical Therapy [address] [place], [place], [address] Phone: [phone]   Fax:  [phone]  Name: Anayia Eugene MRN: [id number] Date of Birth: Dec 04, 1970

## 2020-07-21 ENCOUNTER — Other Ambulatory Visit: Payer: Self-pay

## 2020-07-21 ENCOUNTER — Ambulatory Visit (INDEPENDENT_AMBULATORY_CARE_PROVIDER_SITE_OTHER): Payer: No Typology Code available for payment source | Admitting: Physical Therapy

## 2020-07-21 ENCOUNTER — Encounter: Payer: Self-pay | Admitting: Physical Therapy

## 2020-07-21 DIAGNOSIS — M5441 Lumbago with sciatica, right side: Secondary | ICD-10-CM | POA: Diagnosis not present

## 2020-07-21 DIAGNOSIS — R296 Repeated falls: Secondary | ICD-10-CM | POA: Diagnosis not present

## 2020-07-21 DIAGNOSIS — M5417 Radiculopathy, lumbosacral region: Secondary | ICD-10-CM | POA: Diagnosis not present

## 2020-07-21 DIAGNOSIS — G8929 Other chronic pain: Secondary | ICD-10-CM

## 2020-07-21 DIAGNOSIS — M6281 Muscle weakness (generalized): Secondary | ICD-10-CM | POA: Diagnosis not present

## 2020-07-21 NOTE — Therapy (Signed)
[facility] Physical Therapy [address] [place], [place], [address] Phone: [phone]   Fax:  [phone]  Physical Therapy Treatment  Patient Details  Name: Wendy Deleon MRN: [id number] Date of Birth: 10/03/1970 Referring Provider (PT): Kerrin Champagne, MD   Encounter Date: 07/21/2020   PT End of Session - 07/21/20 0848     Visit Number 3   updated to reflect accurate visit number   Number of Visits 6    Date for PT Re-Evaluation 08/15/20    Authorization Type Aetna    PT Start Time 0800    PT Stop Time 0840    PT Time Calculation (min) 40 min    Activity Tolerance Patient limited by pain    Behavior During Therapy [facility] for tasks assessed/performed             Past Medical History:  Diagnosis Date   Allergy    Anxiety    Arthritis    Dysrhythmia    "skips beats" per pcp.    History of chicken pox     Past Surgical History:  Procedure Laterality Date   HAND SURGERY Right    2014, 2016   lumbar     DDD-treated by Dr Ethelene Hal    There were no vitals filed for this visit.   Subjective Assessment - 07/21/20 0802     Subjective reports near fall and 1 true fall over the past week    Limitations Standing;Walking;Lifting;Sitting    Diagnostic tests MRI: HNP L5/S1    Patient Stated Goals improve mobility and pain    Currently in Pain? Yes    Pain Score 5     Pain Location Back    Pain Orientation Right    Pain Descriptors / Indicators Burning;Shooting;Stabbing    Pain Type Chronic pain    Pain Onset More than a month ago    Pain Frequency Constant    Aggravating Factors  walking, bending, some pain with HEP    Pain Relieving Factors heat, meds                               [facility] Adult PT Treatment/Exercise - 07/21/20 0803       Lumbar Exercises: Stretches   Lower Trunk Rotation 5 reps   15 seconds x 5 bilateral   Prone on Elbows Stretch Limitations x 2 min continuous, then 20 sec break, then 1 min       Lumbar Exercises: Standing   Other Standing Lumbar Exercises lumbar extension standing x 5      Lumbar Exercises: Supine   Ab Set 10 reps;5 seconds    Other Supine Lumbar Exercises supine glute set 5 sec hold x 10 in hooklying      Lumbar Exercises: Sidelying   Clam Both;10 reps;3 seconds                      PT Short Term Goals - 07/21/20 0848       PT SHORT TERM GOAL #1   Title independent with initial HEP    Time 3    Period Weeks    Status Achieved    Target Date 07/25/20               PT Long Term Goals - 07/04/20 1404       PT LONG TERM GOAL #1   Title independent with final HEP  Time 6    Period Weeks    Status New    Target Date 08/15/20      PT LONG TERM GOAL #2   Title FOTO score improved to 45 for improved function    Time 6    Period Weeks    Status New    Target Date 08/15/20      PT LONG TERM GOAL #3   Title demonstrate 5/5 RLE strength for improved function    Time 6    Period Weeks    Status New    Target Date 08/15/20      PT LONG TERM GOAL #4   Title report pain < 6/10 with amb and standing > 10 min for improved strength and activity tolerance    Time 6    Period Weeks    Status New    Target Date 08/15/20      PT LONG TERM GOAL #5   Title report no episodes of falls or buckling for improved mobility and stability    Time 6    Period Weeks    Status New    Target Date 08/15/20                   Plan - 07/21/20 0849     Clinical Impression Statement Pt is independent with initial HEP but continues to have limited tolerance to exercise and activity at this time.  May benefit from additional PT visits at this time.    Personal Factors and Comorbidities Comorbidity 3+;Past/Current Experience;Time since onset of injury/illness/exacerbation    Comorbidities anxiety, arthritis, HNP L5-S1    Examination-Activity Limitations Bathing;Sit;Bed  Mobility;Sleep;Bend;Squat;Stairs;Stand;Toileting;Transfers;Dressing;Hygiene/Grooming;Lift;Locomotion Level    Examination-Participation Restrictions Cleaning;Community Activity;Driving;Yard Work;Laundry;Meal Prep    Stability/Clinical Decision Making Unstable/Unpredictable    Rehab Potential Good    PT Frequency 1x / week    PT Duration 6 weeks    PT Treatment/Interventions ADLs/Self Care Home Management;Aquatic Therapy;Cryotherapy;Electrical Stimulation;Moist Heat;Traction;Balance training;Therapeutic exercise;Therapeutic activities;Functional mobility training;Stair training;Gait training;DME Instruction;Ultrasound;Neuromuscular re-education;Patient/family education;Manual techniques;Taping;Energy conservation;Dry needling;Passive range of motion    PT Next Visit Plan Reassess extension mobility/directional preference response; continue gentle exercise within pain tolerance    PT Home Exercise Plan Access Code: [email or web address]    Consulted and Agree with Plan of Care Patient             Patient will benefit from skilled therapeutic intervention in order to improve the following deficits and impairments:  Abnormal gait, Decreased strength, Pain, Difficulty walking, Decreased mobility, Decreased balance, Decreased range of motion, Postural dysfunction  Visit Diagnosis: Radiculopathy, lumbosacral region  Muscle weakness (generalized)  Repeated falls  Chronic right-sided low back pain with right-sided sciatica     Problem List Patient Active Problem List   Diagnosis Date Noted   Chronic hand pain, right 02/20/2019   Chronic low back pain 02/20/2019   Hyperlipidemia 02/20/2019      Clarita Crane, PT, DPT 07/21/20 8:50 AM     [facility] Physical Therapy [address] [place], [place], [address] Phone: [phone]   Fax:  [phone]  Name: Wendy Deleon MRN: [id number] Date of Birth: 08/14/1970

## 2020-07-28 ENCOUNTER — Ambulatory Visit: Payer: No Typology Code available for payment source | Admitting: Rehabilitative and Restorative Service Providers"

## 2020-07-28 ENCOUNTER — Encounter: Payer: Self-pay | Admitting: Rehabilitative and Restorative Service Providers"

## 2020-07-28 ENCOUNTER — Other Ambulatory Visit: Payer: Self-pay

## 2020-07-28 DIAGNOSIS — M6281 Muscle weakness (generalized): Secondary | ICD-10-CM

## 2020-07-28 DIAGNOSIS — M5417 Radiculopathy, lumbosacral region: Secondary | ICD-10-CM | POA: Diagnosis not present

## 2020-07-28 DIAGNOSIS — R296 Repeated falls: Secondary | ICD-10-CM

## 2020-07-28 DIAGNOSIS — G8929 Other chronic pain: Secondary | ICD-10-CM

## 2020-07-28 DIAGNOSIS — M5441 Lumbago with sciatica, right side: Secondary | ICD-10-CM

## 2020-07-28 NOTE — Therapy (Signed)
[facility] Physical Therapy [address] [place], [place], [address] Phone: [phone]   Fax:  [phone]  Physical Therapy Treatment  Patient Details  Name: Wendy Deleon MRN: [id number] Date of Birth: [birth year] Referring Provider (PT): Kerrin Champagne, MD   Encounter Date: 07/28/2020   PT End of Session - 07/28/20 0826     Visit Number 4    Number of Visits 6    Date for PT Re-Evaluation 08/15/20    Authorization Type Aetna    PT Start Time 0805    PT Stop Time 0830    PT Time Calculation (min) 25 min    Activity Tolerance Patient limited by pain    Behavior During Therapy [facility] for tasks assessed/performed             Past Medical History:  Diagnosis Date   Allergy    Anxiety    Arthritis    Dysrhythmia    "skips beats" per pcp.    History of chicken pox     Past Surgical History:  Procedure Laterality Date   HAND SURGERY Right    2014, 2016   lumbar     DDD-treated by Dr Ethelene Hal    There were no vitals filed for this visit.   Subjective Assessment - 07/28/20 0817     Subjective Pt. indicated feeling terrible and pain at 8/10 today in back and leg.  Pt. indicated she isn't sure what we are gaining right now and wants to get ready for surgery.  Indicated 4 days of hurting after last visit even though in clinic feeling wasn't too bad.    Limitations Standing;Walking;Lifting;Sitting    Diagnostic tests MRI: HNP L5/S1    Patient Stated Goals improve mobility and pain    Currently in Pain? Yes    Pain Score 8     Pain Location Back    Pain Orientation Right    Pain Descriptors / Indicators Burning;Shooting;Stabbing    Pain Type Chronic pain    Pain Onset More than a month ago    Pain Frequency Constant    Aggravating Factors  constantly painful    Pain Relieving Factors "not much"                               [facility] Adult PT Treatment/Exercise - 07/28/20 0001       Self-Care   Other Self-Care  Comments  Continued self care education given on use of HEP and movement to tolerance.  Continued education given regarding neural sensitization and anatomy review of condition.  Discussion of use of heat/ice application for symptom relief in addition to HEP      Modalities   Modalities Electrical Stimulation      Electrical Stimulation   Electrical Stimulation Location Rt lumbar/hip    Electrical Stimulation Action IFC    Electrical Stimulation Parameters to tolerance 15 mins    Electrical Stimulation Goals Pain                      PT Short Term Goals - 07/21/20 0848       PT SHORT TERM GOAL #1   Title independent with initial HEP    Time 3    Period Weeks    Status Achieved    Target Date 07/25/20               PT Long Term Goals - 07/28/20  4034       PT LONG TERM GOAL #1   Title independent with final HEP    Time 6    Period Weeks    Status On-going      PT LONG TERM GOAL #2   Title FOTO score improved to 45 for improved function    Time 6    Period Weeks    Status On-going      PT LONG TERM GOAL #3   Title demonstrate 5/5 RLE strength for improved function    Time 6    Period Weeks    Status On-going      PT LONG TERM GOAL #4   Title report pain < 6/10 with amb and standing > 10 min for improved strength and activity tolerance    Time 6    Period Weeks    Status On-going      PT LONG TERM GOAL #5   Title report no episodes of falls or buckling for improved mobility and stability    Time 6    Period Weeks    Status On-going                   Plan - 07/28/20 7425     Clinical Impression Statement Pt. continued to present c high severity and irritability of symptoms which provide gross functional limitations at this time.  To this point, Pt. has not reported substantial changes in centralization of symptoms or reduced severity for functional gains.  Pt. is currently scheduled for 2 more visits at this time.    Personal Factors  and Comorbidities Comorbidity 3+;Past/Current Experience;Time since onset of injury/illness/exacerbation    Comorbidities anxiety, arthritis, HNP L5-S1    Examination-Activity Limitations Bathing;Sit;Bed Mobility;Sleep;Bend;Squat;Stairs;Stand;Toileting;Transfers;Dressing;Hygiene/Grooming;Lift;Locomotion Level    Examination-Participation Restrictions Cleaning;Community Activity;Driving;Yard Work;Laundry;Meal Prep    Stability/Clinical Decision Making Unstable/Unpredictable    Rehab Potential Good    PT Frequency 1x / week    PT Duration 6 weeks    PT Treatment/Interventions ADLs/Self Care Home Management;Aquatic Therapy;Cryotherapy;Electrical Stimulation;Moist Heat;Traction;Balance training;Therapeutic exercise;Therapeutic activities;Functional mobility training;Stair training;Gait training;DME Instruction;Ultrasound;Neuromuscular re-education;Patient/family education;Manual techniques;Taping;Energy conservation;Dry needling;Passive range of motion    PT Next Visit Plan Continue use of pain management with estim if desired, follow up on mobility gains through HEP.    PT Home Exercise Plan Access Code: [email or web address]    Consulted and Agree with Plan of Care Patient             Patient will benefit from skilled therapeutic intervention in order to improve the following deficits and impairments:  Abnormal gait, Decreased strength, Pain, Difficulty walking, Decreased mobility, Decreased balance, Decreased range of motion, Postural dysfunction  Visit Diagnosis: Chronic right-sided low back pain with right-sided sciatica  Radiculopathy, lumbosacral region  Muscle weakness (generalized)  Repeated falls     Problem List Patient Active Problem List   Diagnosis Date Noted   Chronic hand pain, right 02/20/2019   Chronic low back pain 02/20/2019   Hyperlipidemia 02/20/2019    Chyrel Masson, PT, DPT, OCS, ATC 07/28/20  8:31 AM    [facility] Physical Therapy [address] [place], [place], [address] Phone: [phone]   Fax:  [phone]  Name: Wendy Deleon MRN: [id number] Date of Birth: [birth year]

## 2020-08-04 ENCOUNTER — Ambulatory Visit: Payer: No Typology Code available for payment source | Admitting: Physical Therapy

## 2020-08-04 ENCOUNTER — Encounter: Payer: Self-pay | Admitting: Physical Therapy

## 2020-08-04 ENCOUNTER — Telehealth: Payer: Self-pay | Admitting: Specialist

## 2020-08-04 ENCOUNTER — Other Ambulatory Visit: Payer: Self-pay

## 2020-08-04 DIAGNOSIS — R296 Repeated falls: Secondary | ICD-10-CM | POA: Diagnosis not present

## 2020-08-04 DIAGNOSIS — G8929 Other chronic pain: Secondary | ICD-10-CM

## 2020-08-04 DIAGNOSIS — M5441 Lumbago with sciatica, right side: Secondary | ICD-10-CM

## 2020-08-04 DIAGNOSIS — M6281 Muscle weakness (generalized): Secondary | ICD-10-CM | POA: Diagnosis not present

## 2020-08-04 DIAGNOSIS — M5417 Radiculopathy, lumbosacral region: Secondary | ICD-10-CM

## 2020-08-04 NOTE — Telephone Encounter (Signed)
Error

## 2020-08-04 NOTE — Therapy (Addendum)
[facility] [address] [place], [place], [address] Phone: [phone]   Fax:  [phone]  Physical Therapy Treatment/Discharge Summary  Patient Details  Name: Wendy Deleon MRN: [id number] Date of Birth: [birth year] Referring Provider (PT): Jessy Oto, MD   Encounter Date: 08/04/2020   PT End of Session - 08/04/20 0837     Visit Number 5    Number of Visits 6    Date for PT Re-Evaluation 08/15/20    Authorization Type [employer]    PT Start Time 0807    PT Stop Time 0835    PT Time Calculation (min) 28 min    Activity Tolerance Patient limited by pain    Behavior During Therapy [facility] for tasks assessed/performed             Past Medical History:  Diagnosis Date   Allergy    Anxiety    Arthritis    Dysrhythmia    "skips beats" per pcp.    History of chicken pox     Past Surgical History:  Procedure Laterality Date   HAND SURGERY Right    2014, 2016   lumbar     DDD-treated by Dr Nelva Bush    There were no vitals filed for this visit.   Subjective Assessment - 08/04/20 0809     Subjective burning pain continues to increase, reports 2 falls in the past week - fell into wall.  Reports her RLE gave out on her.    Limitations Standing;Walking;Lifting;Sitting    Diagnostic tests MRI: HNP L5/S1    Patient Stated Goals improve mobility and pain    Currently in Pain? Yes    Pain Score 8     Pain Location Back    Pain Orientation Right    Pain Descriptors / Indicators Burning;Shooting;Stabbing   stinging   Pain Type Chronic pain    Pain Radiating Towards back/hip    Pain Onset More than a month ago    Pain Frequency Constant    Aggravating Factors  constant    Pain Relieving Factors nothing                               [facility] Adult PT Treatment/Exercise - 08/04/20 0810       Lumbar Exercises: Stretches   Single Knee to Chest Stretch 5 reps;20 seconds;Right;Left      Lumbar Exercises:  Supine   Ab Set 10 reps;5 seconds    Other Supine Lumbar Exercises supine glute set 5 sec hold x 10 in hooklying    Other Supine Lumbar Exercises isometric hip ext x 10 reps; Lt side only uable on Rt due to increased pain      Acupuncturist Stimulation Location Rt lumbar/hip    Electrical Stimulation Action IFC    Electrical Stimulation Parameters to tolerance 15 mins    Electrical Stimulation Goals Pain                      PT Short Term Goals - 07/21/20 0848       PT SHORT TERM GOAL #1   Title independent with initial HEP    Time 3    Period Weeks    Status Achieved    Target Date 07/25/20               PT Long Term Goals - 07/28/20 6546  PT LONG TERM GOAL #1   Title independent with final HEP    Time 6    Period Weeks    Status On-going      PT LONG TERM GOAL #2   Title FOTO score improved to 45 for improved function    Time 6    Period Weeks    Status On-going      PT LONG TERM GOAL #3   Title demonstrate 5/5 RLE strength for improved function    Time 6    Period Weeks    Status On-going      PT LONG TERM GOAL #4   Title report pain < 6/10 with amb and standing > 10 min for improved strength and activity tolerance    Time 6    Period Weeks    Status On-going      PT LONG TERM GOAL #5   Title report no episodes of falls or buckling for improved mobility and stability    Time 6    Period Weeks    Status On-going                   Plan - 08/04/20 0837     Clinical Impression Statement Pt continues to have high severity and irritability with PT with limited change in function at this time.  Anticipate d/c next visit due to lack of progress with return to MD to determine next steps.    Personal Factors and Comorbidities Comorbidity 3+;Past/Current Experience;Time since onset of injury/illness/exacerbation    Comorbidities anxiety, arthritis, HNP L5-S1    Examination-Activity Limitations  Bathing;Sit;Bed Mobility;Sleep;Bend;Squat;Stairs;Stand;Toileting;Transfers;Dressing;Hygiene/Grooming;Lift;Locomotion Level    Examination-Participation Restrictions Cleaning;Community Activity;Driving;Yard Work;Laundry;Meal Prep    Stability/Clinical Decision Making Unstable/Unpredictable    Rehab Potential Good    PT Frequency 1x / week    PT Duration 6 weeks    PT Treatment/Interventions ADLs/Self Care Home Management;Aquatic Therapy;Cryotherapy;Electrical Stimulation;Moist Heat;Traction;Balance training;Therapeutic exercise;Therapeutic activities;Functional mobility training;Stair training;Gait training;DME Instruction;Ultrasound;Neuromuscular re-education;Patient/family education;Manual techniques;Taping;Energy conservation;Dry needling;Passive range of motion    PT Next Visit Plan check goals, d/c PT, refer back to MD    PT Home Exercise Plan Access Code: [place] and Agree with Plan of Care Patient             Patient will benefit from skilled therapeutic intervention in order to improve the following deficits and impairments:  Abnormal gait, Decreased strength, Pain, Difficulty walking, Decreased mobility, Decreased balance, Decreased range of motion, Postural dysfunction  Visit Diagnosis: Chronic right-sided low back pain with right-sided sciatica  Radiculopathy, lumbosacral region  Muscle weakness (generalized)  Repeated falls     Problem List Patient Active Problem List   Diagnosis Date Noted   Chronic hand pain, right 02/20/2019   Chronic low back pain 02/20/2019   Hyperlipidemia 02/20/2019      Laureen Abrahams, PT, DPT 08/04/20 8:40 AM     [facility] [address] [place], [place], [address] Phone: [phone]   Fax:  [phone]  Name: Darlena Koval MRN: [id number] Date of Birth: 1970-07-12     PHYSICAL THERAPY DISCHARGE SUMMARY  Visits from Start of Care: 5  Current functional  level related to goals / functional outcomes: See above   Remaining deficits: See above   Education / Equipment: HEP   Patient agrees to discharge. Patient goals were not met. Patient is being discharged due to the patient's request.  Laureen Abrahams, PT, DPT 09/25/20 2:46 PM [facility] [address] [place], [place], [address] Phone: [phone]   Fax:  [phone]

## 2020-08-11 ENCOUNTER — Encounter: Payer: No Typology Code available for payment source | Admitting: Physical Therapy

## 2020-08-16 ENCOUNTER — Telehealth: Payer: Self-pay | Admitting: Specialist

## 2020-08-16 NOTE — Telephone Encounter (Signed)
I called, she states that the PT is making her worse than she was, she states that it is now effecting the left side. I scheduled her to come in on 08/17/20 @ 845

## 2020-08-16 NOTE — Telephone Encounter (Signed)
Patient called. She would like Christy to call her. [phone]

## 2020-08-17 ENCOUNTER — Encounter: Payer: Self-pay | Admitting: Specialist

## 2020-08-17 ENCOUNTER — Other Ambulatory Visit: Payer: Self-pay

## 2020-08-17 ENCOUNTER — Ambulatory Visit: Payer: No Typology Code available for payment source | Admitting: Specialist

## 2020-08-17 VITALS — BP 111/75 | HR 83 | Ht 63.0 in | Wt 164.0 lb

## 2020-08-17 DIAGNOSIS — M5116 Intervertebral disc disorders with radiculopathy, lumbar region: Secondary | ICD-10-CM | POA: Diagnosis not present

## 2020-08-17 DIAGNOSIS — M5441 Lumbago with sciatica, right side: Secondary | ICD-10-CM

## 2020-08-17 DIAGNOSIS — M5416 Radiculopathy, lumbar region: Secondary | ICD-10-CM

## 2020-08-17 DIAGNOSIS — M5136 Other intervertebral disc degeneration, lumbar region: Secondary | ICD-10-CM | POA: Diagnosis not present

## 2020-08-17 DIAGNOSIS — M4316 Spondylolisthesis, lumbar region: Secondary | ICD-10-CM

## 2020-08-17 DIAGNOSIS — G8929 Other chronic pain: Secondary | ICD-10-CM

## 2020-08-17 MED ORDER — HYDROCODONE-ACETAMINOPHEN 5-325 MG PO TABS: 1.0000 | ORAL_TABLET | Freq: Four times a day (QID) | ORAL | 0 refills | Status: DC | PRN

## 2020-08-17 NOTE — Patient Instructions (Addendum)
Plan: Avoid bending, stooping and avoid lifting weights greater than 10 lbs. Avoid prolong standing and walking. Order for a new walker with wheels. Surgery scheduling secretary Tivis Ringer, will call you in the next week to schedule for surgery. Surgery recommended is a one level lumbar bilateral L5-S1 microdiscetomy this would be with the OR microscope. Take tramadol for for pain. Risk of surgery includes risk of infection 1 in 300 patients, bleeding less than 1/4% chance you would need a transfusion. Risk to the nerves is one in 10,000.   Expect improved walking and standing tolerance. Expect relief of leg pain but numbness may persist depending on the length and degree of pressure that has been present.  Please bring the MRI disc to our office so we can schedule intervention, leave at front desk with note to give to Dr. Otelia Sergeant or Neysa Bonito as soon as possible in order to schedule surgical intervention.  Follow-Up Instructions: Return in about 4 weeks (around

## 2020-08-17 NOTE — Progress Notes (Signed)
Office Visit Note   Patient: Wendy Deleon           Date of Birth: 24-Jun-1970           MRN: [id number] Visit Date: 08/17/2020              Requested by: Wynn Banker, MD [address] [place],  [place] [address] PCP: Wynn Banker, MD   Assessment & Plan: Visit Diagnoses:  1. Herniation of lumbar intervertebral disc with radiculopathy   2. Spondylolisthesis, lumbar region   3. Degenerative disc disease, lumbar   4. Right lumbar radiculopathy   5. Chronic midline low back pain with right-sided sciatica     Plan:Avoid bending, stooping and avoid lifting weights greater than 10 lbs. Avoid prolong standing and walking. Order for a new walker with wheels. Surgery scheduling secretary Tivis Ringer, will call you in the next week to schedule for surgery. Surgery recommended is a one level lumbar bilateral L5-S1 microdiscetomy this would be with the OR microscope. Take tramadol for for pain. Risk of surgery includes risk of infection 1 in 300 patients, bleeding less than 1/4% chance you would need a transfusion. Risk to the nerves is one in 10,000.   Expect improved walking and standing tolerance. Expect relief of leg pain but numbness may persist depending on the length and degree of pressure that has been present.  Please bring the MRI disc to our office so we can schedule intervention, leave at front desk with note to give to Dr. Otelia Sergeant or Neysa Bonito as soon as possible in order to schedule surgical intervention.  Follow-Up Instructions: Return in about 4 weeks (around  Follow-Up Instructions: Return in about 4 weeks (around 09/14/2020).   Orders:  No orders of the defined types were placed in this encounter.  No orders of the defined types were placed in this encounter.     Procedures: No procedures performed   Clinical Data: No additional findings.   Subjective: Chief Complaint  Patient presents with   Lower Back - Follow-up     Increased right leg pain and its starting in the left but not as bad as right side.  She has done PT and it makes her worse, after last visit her right leg was completely numb. Having to sit in walker to get around at home.     50 year old female with back pain and radiation into the right greater than left leg. She was seen earlier this year and evaluated with sciatica and back pain. She reports her back pain is greater than the right leg pain but with the recent therapy she feels like her back pain is worse now. Was only able to complete about 5 weeks of the therapy with periods of dysfunction after the therapy sessions where she reports that she would go home or proceed to grocery store leaning on the grocery cart and then went home and that was all she could do. Reports her pain is an 8 on a scale of 1-10. She was Recommended for bilateral microdiscectomy at L5-S1 for central disc herniation with bilateral nerve compression. She report that the pain is no better following course of therapy. No bowel or bladder difficulty.    Review of Systems  Constitutional: Negative.   HENT: Negative.    Eyes: Negative.   Respiratory: Negative.    Cardiovascular: Negative.   Gastrointestinal: Negative.   Endocrine: Negative.   Genitourinary: Negative.   Musculoskeletal: Negative.   Skin:  Negative.   Allergic/Immunologic: Negative.   Neurological: Negative.   Hematological: Negative.   Psychiatric/Behavioral: Negative.      Objective: Vital Signs: BP 111/75 (BP Location: Left Arm, Patient Position: Sitting)   Pulse 83   Ht 5\' 3"  (1.6 m)   Wt 164 lb (74.4 kg)   BMI 29.05 kg/m   Physical Exam Constitutional:      Appearance: She is well-developed.  HENT:     Head: Normocephalic and atraumatic.  Eyes:     Pupils: Pupils are equal, round, and reactive to light.  Pulmonary:     Effort: Pulmonary effort is normal.     Breath sounds: Normal breath sounds.  Abdominal:     General: Bowel sounds  are normal.     Palpations: Abdomen is soft.  Musculoskeletal:     Cervical back: Normal range of motion and neck supple.     Lumbar back: Positive right straight leg raise test and positive left straight leg raise test.  Skin:    General: Skin is warm and dry.  Neurological:     Mental Status: She is alert and oriented to person, place, and time.  Psychiatric:        Behavior: Behavior normal.        Thought Content: Thought content normal.        Judgment: Judgment normal.   Back Exam   Tenderness  The patient is experiencing tenderness in the lumbar.  Range of Motion  Extension:  abnormal  Flexion:  abnormal  Lateral bend right:  abnormal  Lateral bend left:  abnormal  Rotation right:  abnormal  Rotation left:  abnormal   Muscle Strength  Left Quadriceps:  5/5  Right Hamstrings:  5/5  Left Hamstrings:  5/5   Tests  Straight leg raise right: positive Straight leg raise left: positive  Reflexes  Patellar:  2/4 Achilles:  1/4 Biceps:  2/4 Babinski's sign: normal   Other  Toe walk: abnormal Heel walk: abnormal Sensation: normal Gait: abnormal  Erythema: no back redness Scars: absent  Comments:  Right ankle reflex  right 1+ left 2 + Right EHL is weak 4/5. SLr    Specialty Comments:  No specialty comments available.  Imaging: No results found.   PMFS History: Patient Active Problem List   Diagnosis Date Noted   Chronic hand pain, right 02/20/2019   Chronic low back pain 02/20/2019   Hyperlipidemia 02/20/2019   Past Medical History:  Diagnosis Date   Allergy    Anxiety    Arthritis    Dysrhythmia    "skips beats" per pcp.    History of chicken pox     Family History  Problem Relation Age of Onset   Arthritis Mother    Diabetes Mother    Hypertension Mother    Kidney disease Mother    Miscarriages / 04/20/2019 Mother    Arthritis Father    Hearing loss Father    High Cholesterol Father    Hypertension Father    Alcohol abuse Sister     Arthritis Maternal Grandmother    Hearing loss Maternal Grandmother    Heart attack Maternal Grandmother    High blood pressure Maternal Grandmother    Kidney disease Maternal Grandmother    Stroke Maternal Grandmother 95   Arthritis Maternal Grandfather    Hearing loss Maternal Grandfather    Heart attack Maternal Grandfather    Heart disease Maternal Grandfather    High blood pressure Maternal Grandfather  Arthritis/Rheumatoid Paternal Grandmother    Hearing loss Paternal Grandmother    Heart attack Paternal Grandmother    Heart disease Paternal Grandmother    Kidney disease Paternal Grandmother    Other Paternal Grandfather        no medical history known   Asthma Daughter    Migraines Daughter    Learning disabilities Daughter     Past Surgical History:  Procedure Laterality Date   HAND SURGERY Right    2014, 2016   lumbar     DDD-treated by Dr Ethelene Hal   Social History   Occupational History   Not on file  Tobacco Use   Smoking status: Every Day    Packs/day: 0.25    Pack years: 0.00    Types: Cigarettes   Smokeless tobacco: Former  Building services engineer Use: Never used  Substance and Sexual Activity   Alcohol use: No   Drug use: Never   Sexual activity: Yes    Partners: Male    Comment: husband with vasectomy

## 2020-09-08 ENCOUNTER — Other Ambulatory Visit: Payer: Self-pay

## 2020-09-08 ENCOUNTER — Ambulatory Visit (INDEPENDENT_AMBULATORY_CARE_PROVIDER_SITE_OTHER): Payer: No Typology Code available for payment source | Admitting: Specialist

## 2020-09-08 ENCOUNTER — Encounter: Payer: Self-pay | Admitting: Specialist

## 2020-09-08 VITALS — BP 157/87 | HR 84 | Ht 63.0 in | Wt 164.0 lb

## 2020-09-08 DIAGNOSIS — M5136 Other intervertebral disc degeneration, lumbar region: Secondary | ICD-10-CM

## 2020-09-08 DIAGNOSIS — M5116 Intervertebral disc disorders with radiculopathy, lumbar region: Secondary | ICD-10-CM | POA: Diagnosis not present

## 2020-09-08 DIAGNOSIS — M5416 Radiculopathy, lumbar region: Secondary | ICD-10-CM | POA: Diagnosis not present

## 2020-09-08 DIAGNOSIS — M51369 Other intervertebral disc degeneration, lumbar region without mention of lumbar back pain or lower extremity pain: Secondary | ICD-10-CM

## 2020-09-08 DIAGNOSIS — M4316 Spondylolisthesis, lumbar region: Secondary | ICD-10-CM | POA: Diagnosis not present

## 2020-09-08 MED ORDER — HYDROCODONE-ACETAMINOPHEN 7.5-325 MG PO TABS: 1.0000 | ORAL_TABLET | Freq: Four times a day (QID) | ORAL | 0 refills | Status: DC | PRN

## 2020-09-08 NOTE — Addendum Note (Signed)
Addended by: Vira Browns on: 09/08/2020 03:13 PM   Modules accepted: Orders

## 2020-09-08 NOTE — Progress Notes (Signed)
Office Visit Note   Patient: Wendy Deleon           Date of Birth: 30-Jun-1970           MRN: [id number] Visit Date: 09/08/2020              Requested by: Wynn Banker, MD [address] [place],  [place] [address] PCP: Wynn Banker, MD   Assessment & Plan: Visit Diagnoses:  1. Herniation of lumbar intervertebral disc with radiculopathy   2. Spondylolisthesis, lumbar region   3. Degenerative disc disease, lumbar   4. Right lumbar radiculopathy     Plan: Avoid bending, stooping and avoid lifting weights greater than 10 lbs. Avoid prolong standing and walking. Order for a new walker with wheels. Surgery scheduling secretary Tivis Ringer, will call you in the next week to schedule for surgery.  Surgery recommended is a three level lumbar microdisectomies left L2-3, bilateral L4-5 and bilateral L5-S1 this would be done with a microscope. Take hydrocodone for for pain. Risk of surgery includes risk of infection 1 in 200 patients, bleeding 1/2% chance you would need a transfusion.   Risk to the nerves is one in 10,000. You will need to use a brace for 3 months and wean from the brace on the 4th month. Expect improved walking and standing tolerance. Expect relief of leg pain but numbness may persist depending on the length and degree of pressure that has been present.  Follow-Up Instructions: No follow-ups on file.   Orders:  No orders of the defined types were placed in this encounter.  No orders of the defined types were placed in this encounter.     Procedures: No procedures performed   Clinical Data: No additional findings.   Subjective: Chief Complaint  Patient presents with   Lower Back - Pain    50 year old female with history of back pain and sciatica into the right leg and now into the left leg. The pain in the right leg is entire leg front and back and into the left is mainly right anterior thigh. Right leg pain is constant  and worsens with bending and sitting. No bowel or bladder discomfort. She is taking narcotic continuously. She is unable to bend, stoop or walk with out pain into the lumbar spine and radiation into the right and now the left leg. Last seen and evaluated and at that time her persistent sciatica with leg pain greater than back pain that has been treated conservatively for nearly 8 months. She is not able to sleep well and unable to lift, stand, bend or ride comfortablly in the car. The right leg is weak and she is at a Point where she is ready to consider surgery. Her husband brought the CD for MRI done at [facility] in 12/2019 and this has been reviewed.   Review of Systems  Constitutional: Negative.   HENT: Negative.    Eyes: Negative.   Respiratory: Negative.    Cardiovascular: Negative.   Gastrointestinal: Negative.   Endocrine: Negative.   Genitourinary: Negative.   Musculoskeletal: Negative.   Skin: Negative.   Neurological: Negative.   Hematological: Negative.   Psychiatric/Behavioral: Negative.      Objective: Vital Signs: BP (!) 157/87 (BP Location: Left Arm, Patient Position: Sitting)   Pulse 84   Ht 5\' 3"  (1.6 m)   Wt 164 lb (74.4 kg)   BMI 29.05 kg/m   Physical Exam Constitutional:  Appearance: She is well-developed.  HENT:     Head: Normocephalic and atraumatic.  Eyes:     Pupils: Pupils are equal, round, and reactive to light.  Pulmonary:     Effort: Pulmonary effort is normal.     Breath sounds: Normal breath sounds.  Abdominal:     General: Bowel sounds are normal.     Palpations: Abdomen is soft.  Musculoskeletal:     Cervical back: Normal range of motion and neck supple.  Skin:    General: Skin is warm and dry.  Neurological:     Mental Status: She is alert and oriented to person, place, and time.  Psychiatric:        Behavior: Behavior normal.        Thought Content: Thought content normal.        Judgment: Judgment normal.   Back Exam    Tenderness  The patient is experiencing tenderness in the lumbar.  Range of Motion  Extension:  abnormal  Flexion:  abnormal  Lateral bend right:  abnormal  Lateral bend left:  abnormal  Rotation right:  abnormal  Rotation left:  abnormal   Muscle Strength  Right Quadriceps:  5/5  Left Quadriceps:  5/5  Right Hamstrings:  5/5  Left Hamstrings:  5/5   Reflexes  Patellar:  1/4 Achilles:  1/4  Other  Toe walk: abnormal Heel walk: abnormal Sensation: decreased Gait: abnormal  Erythema: no back redness Scars: absent  Comments:  Right foot dorsiflexion weakness 4/5, left hip flexor weakness 4/5,  Right knee extension is weak 5-/5, SLR positive right side. Pain left thigh with standing and walking.     Specialty Comments:  No specialty comments available.  Imaging: No results found.   PMFS History: Patient Active Problem List   Diagnosis Date Noted   Chronic hand pain, right 02/20/2019   Chronic low back pain 02/20/2019   Hyperlipidemia 02/20/2019   Past Medical History:  Diagnosis Date   Allergy    Anxiety    Arthritis    Dysrhythmia    "skips beats" per pcp.    History of chicken pox     Family History  Problem Relation Age of Onset   Arthritis Mother    Diabetes Mother    Hypertension Mother    Kidney disease Mother    Miscarriages / [place] Mother    Arthritis Father    Hearing loss Father    High Cholesterol Father    Hypertension Father    Alcohol abuse Sister    Arthritis Maternal Grandmother    Hearing loss Maternal Grandmother    Heart attack Maternal Grandmother    High blood pressure Maternal Grandmother    Kidney disease Maternal Grandmother    Stroke Maternal Grandmother 58   Arthritis Maternal Grandfather    Hearing loss Maternal Grandfather    Heart attack Maternal Grandfather    Heart disease Maternal Grandfather    High blood pressure Maternal Grandfather    Arthritis/Rheumatoid Paternal Grandmother    Hearing loss  Paternal Grandmother    Heart attack Paternal Grandmother    Heart disease Paternal Grandmother    Kidney disease Paternal Grandmother    Other Paternal Grandfather        no medical history known   Asthma Daughter    Migraines Daughter    Learning disabilities Daughter     Past Surgical History:  Procedure Laterality Date   HAND SURGERY Right    2014, 2016   lumbar  DDD-treated by Dr Ethelene Hal   Social History   Occupational History   Not on file  Tobacco Use   Smoking status: Every Day    Packs/day: 0.25    Types: Cigarettes   Smokeless tobacco: Former  Building services engineer Use: Never used  Substance and Sexual Activity   Alcohol use: No   Drug use: Never   Sexual activity: Yes    Partners: Male    Comment: husband with vasectomy

## 2020-09-11 ENCOUNTER — Telehealth: Payer: Self-pay | Admitting: Family Medicine

## 2020-09-11 NOTE — Telephone Encounter (Signed)
Pt is requesting for someone to give her a call regarding problems she's enduring. Pt phone number is [phone].   Pt did not disclose problems she's having.  Please advise.

## 2020-09-11 NOTE — Telephone Encounter (Signed)
Spoke with the patient and she wanted to let Dr Hassan Rowan know she is still awaiting back surgery by the orthopedic.  Stated ortho gave her an Rx for Hydrocodone for pain and she wanted to know if Dr Hassan Rowan could give her a refill on Diazepam for spasms?  Also stated she received a letter that her disability claim for her broken hand may be discontinued and she wanted to know if Dr Hassan Rowan could help with this?  Patient advised generally disability claims would be handled by the provider who is treating the problem.  Message sent to PCP.

## 2020-09-13 ENCOUNTER — Other Ambulatory Visit: Payer: Self-pay | Admitting: Family Medicine

## 2020-09-13 DIAGNOSIS — F419 Anxiety disorder, unspecified: Secondary | ICD-10-CM

## 2020-09-13 MED ORDER — DIAZEPAM 5 MG PO TABS: 5.0000 mg | ORAL_TABLET | Freq: Two times a day (BID) | ORAL | 1 refills | Status: DC | PRN

## 2020-09-13 NOTE — Telephone Encounter (Signed)
Diazepam refill was sent in for her.  I am not certain she is following with any specialist for the hand now. If she does still follow with them; they could address, but otherwise I can address for her, but would like visit to complete paperwork.

## 2020-09-14 NOTE — Telephone Encounter (Signed)
Patient informed of the message below.  Patient stated the specialist discharged her as he told her there was no additional treatment recommended unless another fracture occurred.  Appt scheduled with PCP on 8/19 as she stated the letter stated paperwork, which she will bring to the appt, has to be completed by 8/24.

## 2020-09-18 ENCOUNTER — Other Ambulatory Visit: Payer: Self-pay

## 2020-09-19 NOTE — Pre-Procedure Instructions (Addendum)
Surgical Instructions    Your procedure is scheduled on Friday, August 12th.  Report to [facility] Main Entrance "A" at 10:15 A.M., then check in with the Admitting office.  Call this number if you have problems the morning of surgery:  [phone]   If you have any questions prior to your surgery date call [phone]: Open Monday-Friday 8am-4pm    Remember:  Do not eat after midnight the night before your surgery  You may drink clear liquids until 9:15 a.m. the morning of your surgery.   Clear liquids allowed are: Water, Non-Citrus Juices (without pulp), Carbonated Beverages, Clear Tea, Black Coffee Only, and Gatorade.   Enhanced Recovery after Surgery for Orthopedics Enhanced Recovery after Surgery is a protocol used to improve the stress on your body and your recovery after surgery.  Patient Instructions  The day of surgery (if you do NOT have diabetes):  Drink ONE (1) Pre-Surgery Clear Ensure by 9:15 a.m. the morning of surgery   This drink was given to you during your hospital  pre-op appointment visit. Nothing else to drink after completing the  Pre-Surgery Clear Ensure.         If you have questions, please contact your surgeon's office.     Take these medicines the morning of surgery with A SIP OF WATER  cetirizine (ZYRTEC)  pravastatin (PRAVACHOL)    Take these medicines as needed: diazepam (VALIUM) HYDROcodone-acetaminophen (NORCO)  simethicone (MYLICON)  As of today, STOP taking any Aspirin (unless otherwise instructed by your surgeon) Aleve, Naproxen, Ibuprofen, Motrin, Advil, Goody's, BC's, all herbal medications, fish oil, and all vitamins.                     Do NOT Smoke (Tobacco/Vaping) or drink Alcohol 24 hours prior to your procedure.  If you use a CPAP at night, you may bring all equipment for your overnight stay.   Contacts, glasses, piercing's, hearing aid's, dentures or partials may not be worn into surgery, please bring cases for these  belongings.    For patients admitted to the hospital, discharge time will be determined by your treatment team.   Patients discharged the day of surgery will not be allowed to drive home, and someone needs to stay with them for 24 hours.  ONLY 1 SUPPORT PERSON MAY BE PRESENT WHILE YOU ARE IN SURGERY. IF YOU ARE TO BE ADMITTED ONCE YOU ARE IN YOUR ROOM YOU WILL BE ALLOWED TWO (2) VISITORS.  Minor children may have two parents present. Special consideration for safety and communication needs will be reviewed on a case by case basis.   Special instructions:   - Preparing For Surgery  Before surgery, you can play an important role. Because skin is not sterile, your skin needs to be as free of germs as possible. You can reduce the number of germs on your skin by washing with CHG (chlorahexidine gluconate) Soap before surgery.  CHG is an antiseptic cleaner which kills germs and bonds with the skin to continue killing germs even after washing.    Oral Hygiene is also important to reduce your risk of infection.  Remember - BRUSH YOUR TEETH THE MORNING OF SURGERY WITH YOUR REGULAR TOOTHPASTE  Please do not use if you have an allergy to CHG or antibacterial soaps. If your skin becomes reddened/irritated stop using the CHG.  Do not shave (including legs and underarms) for at least 48 hours prior to first CHG shower. It is OK to shave your  face.  Please follow these instructions carefully.   Shower the NIGHT BEFORE SURGERY and the MORNING OF SURGERY  If you chose to wash your hair, wash your hair first as usual with your normal shampoo.  After you shampoo, rinse your hair and body thoroughly to remove the shampoo.  Use CHG Soap as you would any other liquid soap. You can apply CHG directly to the skin and wash gently with a scrungie or a clean washcloth.   Apply the CHG Soap to your body ONLY FROM THE NECK DOWN.  Do not use on open wounds or open sores. Avoid contact with your eyes, ears,  mouth and genitals (private parts). Wash Face and genitals (private parts)  with your normal soap.   Wash thoroughly, paying special attention to the area where your surgery will be performed.  Thoroughly rinse your body with warm water from the neck down.  DO NOT shower/wash with your normal soap after using and rinsing off the CHG Soap.  Pat yourself dry with a CLEAN TOWEL.  Wear CLEAN PAJAMAS to bed the night before surgery  Place CLEAN SHEETS on your bed the night before your surgery  DO NOT SLEEP WITH PETS.   Day of Surgery: Shower with CHG soap. Do not wear jewelry, make up, nail polish, gel polish, artificial nails, or any other type of covering on natural nails including finger and toenails. If patients have artificial nails, gel coating, etc. that need to be removed by a nail salon please have this removed prior to surgery. Surgery may need to be canceled/delayed if the surgeon/ anesthesia feels like the patient is unable to be adequately monitored. Do not wear lotions, powders, perfumes, or deodorant. Do not shave 48 hours prior to surgery.   Do not bring valuables to the hospital. [facility] is not responsible for any belongings or valuables. Wear Clean/Comfortable clothing the morning of surgery Remember to brush your teeth WITH YOUR REGULAR TOOTHPASTE.   Please read over the following fact sheets that you were given.

## 2020-09-20 ENCOUNTER — Encounter (HOSPITAL_COMMUNITY): Payer: Self-pay

## 2020-09-20 ENCOUNTER — Other Ambulatory Visit: Payer: Self-pay

## 2020-09-20 ENCOUNTER — Encounter (HOSPITAL_COMMUNITY)
Admission: RE | Admit: 2020-09-20 | Discharge: 2020-09-20 | Disposition: A | Discharge status: Home / Self Care | Payer: No Typology Code available for payment source | Source: Ambulatory Visit | Attending: Specialist | Admitting: Specialist

## 2020-09-20 DIAGNOSIS — Z01818 Encounter for other preprocedural examination: Secondary | ICD-10-CM | POA: Insufficient documentation

## 2020-09-20 DIAGNOSIS — Z20822 Contact with and (suspected) exposure to covid-19: Secondary | ICD-10-CM | POA: Insufficient documentation

## 2020-09-20 HISTORY — DX: Family history of other specified conditions: Z84.89

## 2020-09-20 LAB — CBC
HCT: 40.9 % (ref 36.0–46.0)
Hemoglobin: 13.6 g/dL (ref 12.0–15.0)
MCH: 32.2 pg (ref 26.0–34.0)
MCHC: 33.3 g/dL (ref 30.0–36.0)
MCV: 96.9 fL (ref 80.0–100.0)
Platelets: 205 10*3/uL (ref 150–400)
RBC: 4.22 MIL/uL (ref 3.87–5.11)
RDW: 11.5 % (ref 11.5–15.5)
WBC: 11.5 10*3/uL — ABNORMAL HIGH (ref 4.0–10.5)
nRBC: 0 % (ref 0.0–0.2)

## 2020-09-20 LAB — URINALYSIS, MICROSCOPIC (REFLEX): RBC / HPF: NONE SEEN RBC/hpf (ref 0–5)

## 2020-09-20 LAB — URINALYSIS, ROUTINE W REFLEX MICROSCOPIC
Bilirubin Urine: NEGATIVE
Glucose, UA: NEGATIVE mg/dL
Ketones, ur: NEGATIVE mg/dL
Leukocytes,Ua: NEGATIVE
Nitrite: NEGATIVE
Protein, ur: NEGATIVE mg/dL
Specific Gravity, Urine: <1.005 — ABNORMAL LOW (ref 1.005–1.030)
pH: 5.5 (ref 5.0–8.0)

## 2020-09-20 LAB — COMPREHENSIVE METABOLIC PANEL
ALT: 16 U/L (ref 0–44)
AST: 16 U/L (ref 15–41)
Albumin: 3.7 g/dL (ref 3.5–5.0)
Alkaline Phosphatase: 73 U/L (ref 38–126)
Anion gap: 8 (ref 5–15)
BUN: 12 mg/dL (ref 6–20)
CO2: 24 mmol/L (ref 22–32)
Calcium: 9.6 mg/dL (ref 8.9–10.3)
Chloride: 106 mmol/L (ref 98–111)
Creatinine, Ser: 0.63 mg/dL (ref 0.44–1.00)
GFR, Estimated: >60 mL/min (ref >60)
Glucose, Bld: 97 mg/dL (ref 70–99)
Potassium: 3.7 mmol/L (ref 3.5–5.1)
Sodium: 138 mmol/L (ref 135–145)
Total Bilirubin: 0.5 mg/dL (ref 0.3–1.2)
Total Protein: 7.1 g/dL (ref 6.5–8.1)

## 2020-09-20 LAB — PROTIME-INR
INR: 0.9 (ref 0.8–1.2)
Prothrombin Time: 12.1 seconds (ref 11.4–15.2)

## 2020-09-20 LAB — SURGICAL PCR SCREEN
MRSA, PCR: NEGATIVE
Staphylococcus aureus: NEGATIVE

## 2020-09-20 LAB — SARS CORONAVIRUS 2 (TAT 6-24 HRS): SARS Coronavirus 2: NEGATIVE

## 2020-09-20 NOTE — Progress Notes (Addendum)
PCP - Theodis Shove MD Cardiologist - denies  PPM/ICD - denies  Chest x-ray - 05/21/19 EKG - 09/20/20 (At PAT appt) Stress Test - denies ECHO - denies Cardiac Cath - denies  Sleep Study - denies  DM: denies   ERAS Protcol -Yes PRE-SURGERY G2 (pt refused Ensure so G2 was given)  COVID TEST- swabbed on 09/20/20 at PAT appt. Results pending   Anesthesia review: Yes, abnormal EKG  Patient denies shortness of breath, fever, cough and chest pain at PAT appointment   All instructions explained to the patient, with a verbal understanding of the material. Patient agrees to go over the instructions while at home for a better understanding. The opportunity to ask questions was provided.

## 2020-09-21 ENCOUNTER — Ambulatory Visit (INDEPENDENT_AMBULATORY_CARE_PROVIDER_SITE_OTHER): Payer: No Typology Code available for payment source | Admitting: Surgery

## 2020-09-21 ENCOUNTER — Encounter: Payer: Self-pay | Admitting: Surgery

## 2020-09-21 ENCOUNTER — Encounter (HOSPITAL_COMMUNITY): Payer: Self-pay

## 2020-09-21 VITALS — BP 131/80 | HR 87 | Ht 64.0 in | Wt 164.4 lb

## 2020-09-21 DIAGNOSIS — M5116 Intervertebral disc disorders with radiculopathy, lumbar region: Secondary | ICD-10-CM

## 2020-09-21 NOTE — Anesthesia Preprocedure Evaluation (Addendum)
Anesthesia Evaluation  Patient identified by MRN, date of birth, ID band Patient awake    Reviewed: Allergy & Precautions, NPO status , Patient's Chart, lab work & pertinent test results  History of Anesthesia Complications Negative for: history of anesthetic complications  Airway Mallampati: II  TM Distance: >3 FB Neck ROM: Full    Dental no notable dental hx.    Pulmonary Current Smoker and Patient abstained from smoking.,    Pulmonary exam normal        Cardiovascular negative cardio ROS Normal cardiovascular exam     Neuro/Psych Anxiety    GI/Hepatic negative GI ROS, Neg liver ROS,   Endo/Other  negative endocrine ROS  Renal/GU negative Renal ROS     Musculoskeletal  (+) Arthritis ,   Abdominal   Peds  Hematology negative hematology ROS (+)   Anesthesia Other Findings   Reproductive/Obstetrics                            Anesthesia Physical Anesthesia Plan  ASA: 2  Anesthesia Plan: General   Post-op Pain Management:    Induction: Intravenous  PONV Risk Score and Plan: Treatment may vary due to age or medical condition, Ondansetron, Dexamethasone and Midazolam  Airway Management Planned: Oral ETT  Additional Equipment:   Intra-op Plan:   Post-operative Plan: Extubation in OR  Informed Consent: I have reviewed the patients History and Physical, chart, labs and discussed the procedure including the risks, benefits and alternatives for the proposed anesthesia with the patient or authorized representative who has indicated his/her understanding and acceptance.     Dental advisory given  Plan Discussed with: CRNA  Anesthesia Plan Comments: (PAT note written 09/21/2020 by Shonna Chock, PA-C. )       Anesthesia Quick Evaluation

## 2020-09-21 NOTE — H&P (Signed)
Wendy Deleon is an 50 y.o. female.   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  States that low back pain and lower extremity radiculopathy is unchanged from previous visit.  She is wanting to proceed with microdiscectomies at left L2-3 and bilateral at L4-5 and L5-S1.   Past Medical History:  Diagnosis Date   Allergy    Anxiety    Arthritis    Dysrhythmia    "skips beats" per pcp.    Family history of adverse reaction to anesthesia    father "his body would not wake up from anesthesia, started going into organ failure   History of chicken pox     Past Surgical History:  Procedure Laterality Date   HAND SURGERY Right    2014, 2016   lumbar     DDD-treated by Dr Ethelene Hal    Family History  Problem Relation Age of Onset   Arthritis Mother    Diabetes Mother    Hypertension Mother    Kidney disease Mother    Miscarriages / [place] Mother    Arthritis Father    Hearing loss Father    High Cholesterol Father    Hypertension Father    Diabetes Sister    Alcohol abuse Sister    Asthma Daughter    Migraines Daughter    Learning disabilities Daughter    Arthritis Maternal Grandmother    Hearing loss Maternal Grandmother    Heart attack Maternal Grandmother    High blood pressure Maternal Grandmother    Kidney disease Maternal Grandmother    Stroke Maternal Grandmother 29   Arthritis Maternal Grandfather    Hearing loss Maternal Grandfather    Heart attack Maternal Grandfather    Heart disease Maternal Grandfather    High blood pressure Maternal Grandfather    Arthritis/Rheumatoid Paternal Grandmother    Hearing loss Paternal Grandmother    Heart attack Paternal Grandmother    Heart disease Paternal Grandmother    Kidney disease Paternal Grandmother    Other Paternal Grandfather        no medical history known   Social History:  reports  that she has been smoking cigarettes. She has been smoking an average of 1 pack per day. She has never used smokeless tobacco. She reports that she does not drink alcohol and does not use drugs.  Allergies:  Allergies  Allergen Reactions   Cyclobenzaprine Anaphylaxis and Shortness Of Breath    Tongue swelling, throat burning, "feels weird to breathe"   Tizanidine Anaphylaxis and Shortness Of Breath    Tongue swelling, throat burning, "feels weird to breathe"   Eggs Or Egg-Derived Products Other (See Comments)    Vomiting, stomach swelling and diarrhea   Hydrocodone     Migraine, tolerates with advil   Oxycodone     Feels over-medicated   Tape Rash    adhesive    No medications prior to admission.    Results for orders placed or performed during the hospital encounter of 09/20/20 (from the past 48 hour(s))  CBC     Status: Abnormal   Collection Time: 09/20/20  9:38 AM  Result Value Ref Range   WBC 11.5 (H) 4.0 - 10.5 K/uL   RBC 4.22 3.87 - 5.11 MIL/uL   Hemoglobin 13.6 12.0 - 15.0 g/dL   HCT 51.7 00.1 - 74.9 %   MCV 96.9 80.0 - 100.0 fL  MCH 32.2 26.0 - 34.0 pg   MCHC 33.3 30.0 - 36.0 g/dL   RDW 33.5 45.6 - 25.6 %   Platelets 205 150 - 400 K/uL   nRBC 0.0 0.0 - 0.2 %    Comment: Performed at [facility], [address], [place], [place] [address]  Comprehensive metabolic panel     Status: None   Collection Time: 09/20/20  9:38 AM  Result Value Ref Range   Sodium 138 135 - 145 mmol/L   Potassium 3.7 3.5 - 5.1 mmol/L   Chloride 106 98 - 111 mmol/L   CO2 24 22 - 32 mmol/L   Glucose, Bld 97 70 - 99 mg/dL    Comment: Glucose reference range applies only to samples taken after fasting for at least 8 hours.   BUN 12 6 - 20 mg/dL   Creatinine, Ser 3.42 0.44 - 1.00 mg/dL   Calcium 9.6 8.9 - 87.6 mg/dL   Total Protein 7.1 6.5 - 8.1 g/dL   Albumin 3.7 3.5 - 5.0 g/dL   AST 16 15 - 41 U/L   ALT 16 0 - 44 U/L   Alkaline Phosphatase 73 38 - 126 U/L   Total Bilirubin 0.5  0.3 - 1.2 mg/dL   GFR, Estimated >81 >15 mL/min    Comment: (NOTE) Calculated using the CKD-EPI Creatinine Equation (2021)    Anion gap 8 5 - 15    Comment: Performed at [facility], [address] [place], [place] [address]  Protime-INR     Status: None   Collection Time: 09/20/20  9:38 AM  Result Value Ref Range   Prothrombin Time 12.1 11.4 - 15.2 seconds   INR 0.9 0.8 - 1.2    Comment: (NOTE) INR goal varies based on device and disease states. Performed at [facility], [address], [place], [place] [address]   Urinalysis, Routine w reflex microscopic     Status: Abnormal   Collection Time: 09/20/20  9:39 AM  Result Value Ref Range   Color, Urine YELLOW YELLOW   APPearance CLEAR CLEAR   Specific Gravity, Urine <1.005 (L) 1.005 - 1.030   pH 5.5 5.0 - 8.0   Glucose, UA NEGATIVE NEGATIVE mg/dL   Hgb urine dipstick TRACE (A) NEGATIVE   Bilirubin Urine NEGATIVE NEGATIVE   Ketones, ur NEGATIVE NEGATIVE mg/dL   Protein, ur NEGATIVE NEGATIVE mg/dL   Nitrite NEGATIVE NEGATIVE   Leukocytes,Ua NEGATIVE NEGATIVE    Comment: Performed at [facility], [address], [place], [place] [address]  Surgical pcr screen     Status: None   Collection Time: 09/20/20  9:39 AM   Specimen: Nasal Mucosa; Nasal Swab  Result Value Ref Range   MRSA, PCR NEGATIVE NEGATIVE   Staphylococcus aureus NEGATIVE NEGATIVE    Comment: (NOTE) The Xpert SA Assay (FDA approved for NASAL specimens in patients 24 years of age and older), is one component of a comprehensive surveillance program. It is not intended to diagnose infection nor to guide or monitor treatment. Performed at [facility], [address] [place], [place] [address]   Urinalysis, Microscopic (reflex)     Status: Abnormal   Collection Time: 09/20/20  9:39 AM  Result Value Ref Range   RBC / HPF NONE SEEN 0 - 5 RBC/hpf   WBC, UA 0-5 0 - 5 WBC/hpf   Bacteria, UA RARE (A) NONE SEEN   Squamous Epithelial / LPF  0-5 0 -  5    Comment: Performed at [facility], [address], [place], [place] [address]  SARS CORONAVIRUS 2 (TAT 6-24 HRS) Nasopharyngeal Nasopharyngeal Swab     Status: None   Collection Time: 09/20/20  9:44 AM   Specimen: Nasopharyngeal Swab  Result Value Ref Range   SARS Coronavirus 2 NEGATIVE NEGATIVE    Comment: (NOTE) SARS-CoV-2 target nucleic acids are NOT DETECTED.  The SARS-CoV-2 RNA is generally detectable in upper and lower respiratory specimens during the acute phase of infection. Negative results do not preclude SARS-CoV-2 infection, do not rule out co-infections with other pathogens, and should not be used as the sole basis for treatment or other patient management decisions. Negative results must be combined with clinical observations, patient history, and epidemiological information. The expected result is Negative.  Fact Sheet for Patients: HairSlick.no  Fact Sheet for Healthcare Providers: quierodirigir.com  This test is not yet approved or cleared by the [place] FDA and  has been authorized for detection and/or diagnosis of SARS-CoV-2 by FDA under an Emergency Use Authorization (EUA). This EUA will remain  in effect (meaning this test can be used) for the duration of the COVID-19 declaration under Se ction 564(b)(1) of the Act, 21 U.S.C. section 360bbb-3(b)(1), unless the authorization is terminated or revoked sooner.  Performed at [facility], [address] [place], [place] [address]    No results found.  Review of Systems  Constitutional:  Positive for activity change.  HENT: Negative.    Respiratory: Negative.    Cardiovascular: Negative.   Genitourinary: Negative.   Musculoskeletal:  Positive for back pain and gait problem.  Neurological:  Positive for numbness.  Psychiatric/Behavioral: Negative.     There were no vitals taken for this visit. Physical  Exam Constitutional:      Appearance: Normal appearance.  HENT:     Head: Normocephalic.  Eyes:     Extraocular Movements: Extraocular movements intact.  Cardiovascular:     Rate and Rhythm: Regular rhythm.     Heart sounds: Normal heart sounds. No murmur heard. Pulmonary:     Effort: No respiratory distress.     Breath sounds: Normal breath sounds.  Abdominal:     General: There is no distension.  Musculoskeletal:        General: Tenderness present.  Neurological:     Mental Status: She is alert and oriented to person, place, and time.  Psychiatric:        Mood and Affect: Mood normal.     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  We will proceed with microdiscectomies at left L2-3 and bilateral at L4-5 and L5-S1 as scheduled.  Surgical procedure discussed along with potential rehab/recovery time.  All questions answered.  Zonia Kief, PA-C 09/21/2020, 6:35 PM

## 2020-09-21 NOTE — Progress Notes (Signed)
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  States that low back pain and lower extremity radiculopathy is unchanged from previous visit.  She is wanting to proceed with microdiscectomies at left L2-3 and bilateral at L4-5 and L5-S1.  Today history physical performed.  Review of systems negative.  Surgical procedure discussed in great detail along with what to expect during the immediate postop recovery time.  All questions answered.

## 2020-09-21 NOTE — Progress Notes (Signed)
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 Surgery initially planned in May, but was postponed until she had tried PT for a 6 week total.   History includes smoking, anxiety, dysrhythmia (infrequent PVCs associated with symptoms of "skipped beats" on 11/2019 30 day event monitor), right hand surgery (five finger injury from skill saw accident 06/27/12, s/p 4 surgeries [facility]). She reported that her father "would not wake up from anesthesia, started going into organ failure."  09/20/2020 presurgical COVID-19 test negative.  Anesthesia team to evaluate on the day of surgery.  VS: BP (!) 144/78   Pulse 80   Temp 36.9 C   Resp 17   Ht 5\' 3"  (1.6 m)   Wt 74.8 kg   SpO2 100%   BMI 29.23 kg/m   PROVIDERS: , MD is PCP.  Established care 02/19/2019. Last evaluation 03/31/20.   LABS: Labs reviewed: Acceptable for surgery. (all labs ordered are listed, but only abnormal results are displayed)  Labs Reviewed  CBC - Abnormal; Notable for the following components:      Result Value   WBC 11.5 (*)    All other components within normal limits  URINALYSIS, ROUTINE W REFLEX MICROSCOPIC - Abnormal; Notable for the following components:   Specific Gravity, Urine <1.005 (*)    Hgb urine dipstick TRACE (*)    All other components within normal limits  URINALYSIS, MICROSCOPIC (REFLEX) - Abnormal; Notable for the following components:   Bacteria, UA RARE (*)    All other components within normal limits  SURGICAL PCR SCREEN  SARS CORONAVIRUS 2 (TAT 6-24 HRS)   COMPREHENSIVE METABOLIC PANEL  PROTIME-INR     IMAGES: MRI L-spine 03/28/20 ([facility]): 1.  Large right central subarticular L5-S1 disc extrusion with moderate to severe caudal migration resulting in impingement and dorsal lateral displacement of descending right S1-S2 nerve roots.  Moderate left subarticular zone narrowing with mild spinal canal stenosis resulting in mild impingement upon descending left S1 nerve roots due to moderate-severe posterior bulging disc. 2.  Small-moderate left foraminal L4-5 disc protrusion superimposed upon bulging disc asymmetric to the left anterior listhesis of L4 on L5 resulting in moderate left and mild right neuroforaminal narrowing with contact of exiting left L4 nerve root. 3.  Mild left central L2-3 disc extrusion with mild cranial migration without spinal canal stenosis or contact of descending nerve roots. 4. Mild dextroconvex lumbar scoliosis.  Degenerative grade I anterolisthesis of L4 and L5 with severe bilateral facet arthrosis. Addendum: Disc extrusion at L5-S1 is new and left L4-5 foraminal disc protrusion is new resulting in progression of left neural foraminal narrowing.  Findings at L2-3 are unchanged.   EKG: 09/20/20: Normal sinus rhythm Low voltage QRS Cannot rule out Anterior infarct , age undetermined Abnormal ECG No significant change since last tracing Confirmed by 11/20/20 [phone]) on 09/20/2020 5:36:20 PM   CV: Cardiac event monitor 11/18/19-12/17/19: Predominant rhythm sinus rhythm, minimum heart rate 53 bpm, maximum heart rate 1 1 bpm, average heart rate 75 bpm. Infrequent PVCs. No pauses or arrhythmias.   Benign 30-day event monitor, the  patient was in sinus rhythm the entire monitoring time, her symptoms of skipped beats and shortness of breath correlated with occasional PVCs.  No other arrhythmias were identified.     Past Medical History:  Diagnosis Date   Allergy    Anxiety    Arthritis    Dysrhythmia     "skips beats" per pcp.    Family history of adverse reaction to anesthesia    father "his body would not wake up from anesthesia, started going into organ failure   History of chicken pox     Past Surgical History:  Procedure Laterality Date   HAND SURGERY Right    2014, 2016   lumbar     DDD-treated by Dr Ethelene Hal    MEDICATIONS:  augmented betamethasone dipropionate (DIPROLENE-AF) 0.05 % ointment   cetirizine (ZYRTEC) 10 MG tablet   citalopram (CELEXA) 10 MG tablet   diazepam (VALIUM) 5 MG tablet   HYDROcodone-acetaminophen (NORCO) 7.5-325 MG tablet   HYDROcodone-acetaminophen (NORCO/VICODIN) 5-325 MG tablet   ibuprofen (ADVIL) 200 MG tablet   Multiple Vitamins-Minerals (MULTIVITAMIN ADULTS PO)   Norethindrone-Ethinyl Estradiol-Fe Biphas (LO LOESTRIN FE) 1 MG-10 MCG / 10 MCG tablet   pravastatin (PRAVACHOL) 40 MG tablet   simethicone (MYLICON) 125 MG chewable tablet   traMADol (ULTRAM) 50 MG tablet   No current facility-administered medications for this encounter.    Shonna Chock, PA-C Surgical Short Stay/Anesthesiology [facility] Phone [phone] [facility] Phone [phone] 09/21/2020 9:50 AM

## 2020-09-22 ENCOUNTER — Other Ambulatory Visit: Payer: Self-pay

## 2020-09-22 ENCOUNTER — Encounter (HOSPITAL_COMMUNITY)
Admission: RE | Disposition: A | Discharge status: Home / Self Care | Payer: Self-pay | Source: Home / Self Care | Attending: Specialist

## 2020-09-22 ENCOUNTER — Ambulatory Visit (HOSPITAL_COMMUNITY): Payer: No Typology Code available for payment source | Admitting: Physician Assistant

## 2020-09-22 ENCOUNTER — Encounter (HOSPITAL_COMMUNITY): Payer: Self-pay | Admitting: Specialist

## 2020-09-22 ENCOUNTER — Observation Stay (HOSPITAL_COMMUNITY)
Admission: RE | Admit: 2020-09-22 | Discharge: 2020-09-23 | Disposition: A | Discharge status: Home / Self Care | Payer: No Typology Code available for payment source | Attending: Specialist | Admitting: Specialist

## 2020-09-22 ENCOUNTER — Ambulatory Visit (HOSPITAL_COMMUNITY): Payer: No Typology Code available for payment source

## 2020-09-22 ENCOUNTER — Ambulatory Visit (HOSPITAL_COMMUNITY): Payer: No Typology Code available for payment source | Admitting: Anesthesiology

## 2020-09-22 DIAGNOSIS — Z9889 Other specified postprocedural states: Secondary | ICD-10-CM

## 2020-09-22 DIAGNOSIS — F17210 Nicotine dependence, cigarettes, uncomplicated: Secondary | ICD-10-CM | POA: Insufficient documentation

## 2020-09-22 DIAGNOSIS — M4726 Other spondylosis with radiculopathy, lumbar region: Secondary | ICD-10-CM

## 2020-09-22 DIAGNOSIS — Z419 Encounter for procedure for purposes other than remedying health state, unspecified: Secondary | ICD-10-CM

## 2020-09-22 DIAGNOSIS — M5116 Intervertebral disc disorders with radiculopathy, lumbar region: Principal | ICD-10-CM

## 2020-09-22 HISTORY — PX: LUMBAR LAMINECTOMY/DECOMPRESSION MICRODISCECTOMY: SHX5026

## 2020-09-22 LAB — POCT PREGNANCY, URINE: Preg Test, Ur: NEGATIVE

## 2020-09-22 SURGERY — LUMBAR LAMINECTOMY/DECOMPRESSION MICRODISCECTOMY
Anesthesia: General | Site: Spine Lumbar

## 2020-09-22 MED ORDER — ROCURONIUM BROMIDE 10 MG/ML (PF) SYRINGE
PREFILLED_SYRINGE | INTRAVENOUS | Status: AC
  Filled 2020-09-22: qty 10

## 2020-09-22 MED ORDER — PROPOFOL 10 MG/ML IV BOLUS
INTRAVENOUS | Status: DC | PRN
  Administered 2020-09-22: 50 mg via INTRAVENOUS
  Administered 2020-09-22: 150 mg via INTRAVENOUS

## 2020-09-22 MED ORDER — FENTANYL CITRATE (PF) 250 MCG/5ML IJ SOLN
INTRAMUSCULAR | Status: DC | PRN
  Administered 2020-09-22: 100 ug via INTRAVENOUS
  Administered 2020-09-22: 50 ug via INTRAVENOUS
  Administered 2020-09-22: 100 ug via INTRAVENOUS
  Administered 2020-09-22 (×4): 50 ug via INTRAVENOUS

## 2020-09-22 MED ORDER — MORPHINE SULFATE (PF) 2 MG/ML IV SOLN: 1.0000 mg | INTRAVENOUS | Status: DC | PRN

## 2020-09-22 MED ORDER — BUPIVACAINE HCL 0.5 % IJ SOLN
INTRAMUSCULAR | Status: DC | PRN
  Administered 2020-09-22: 36 mL

## 2020-09-22 MED ORDER — HYDROCODONE-ACETAMINOPHEN 7.5-325 MG PO TABS: 1.0000 | ORAL_TABLET | Freq: Four times a day (QID) | ORAL | Status: DC | PRN

## 2020-09-22 MED ORDER — MENTHOL 3 MG MT LOZG: 1.0000 | LOZENGE | OROMUCOSAL | Status: DC | PRN

## 2020-09-22 MED ORDER — ACETAMINOPHEN 325 MG PO TABS: 650.0000 mg | ORAL_TABLET | ORAL | Status: DC | PRN

## 2020-09-22 MED ORDER — PROMETHAZINE HCL 25 MG/ML IJ SOLN
INTRAMUSCULAR | Status: AC
  Filled 2020-09-22: qty 1

## 2020-09-22 MED ORDER — ACETAMINOPHEN 10 MG/ML IV SOLN
1000.0000 mg | Freq: Once | INTRAVENOUS | Status: DC | PRN
  Administered 2020-09-22: 1000 mg via INTRAVENOUS

## 2020-09-22 MED ORDER — TRIAMCINOLONE ACETONIDE 0.5 % EX CREA
TOPICAL_CREAM | Freq: Four times a day (QID) | CUTANEOUS | Status: DC
  Filled 2020-09-22: qty 15

## 2020-09-22 MED ORDER — HYDROMORPHONE HCL 1 MG/ML IJ SOLN
INTRAMUSCULAR | Status: AC
  Filled 2020-09-22: qty 1

## 2020-09-22 MED ORDER — SODIUM CHLORIDE 0.9% FLUSH: 3.0000 mL | Freq: Two times a day (BID) | INTRAVENOUS | Status: DC

## 2020-09-22 MED ORDER — BUPIVACAINE LIPOSOME 1.3 % IJ SUSP
10.0000 mL | Freq: Once | INTRAMUSCULAR | Status: DC
Start: 2020-09-22 — End: 2020-09-22
  Filled 2020-09-22: qty 10

## 2020-09-22 MED ORDER — THROMBIN 20000 UNITS EX SOLR: CUTANEOUS | Status: DC | PRN

## 2020-09-22 MED ORDER — METHOCARBAMOL 500 MG PO TABS
500.0000 mg | ORAL_TABLET | Freq: Four times a day (QID) | ORAL | Status: DC | PRN
  Filled 2020-09-22: qty 1

## 2020-09-22 MED ORDER — HYDROCODONE-ACETAMINOPHEN 7.5-325 MG PO TABS
2.0000 | ORAL_TABLET | ORAL | Status: DC | PRN
Start: 2020-09-22 — End: 2020-09-23

## 2020-09-22 MED ORDER — EPHEDRINE SULFATE 50 MG/ML IJ SOLN
INTRAMUSCULAR | Status: DC | PRN
  Administered 2020-09-22: 5 mg via INTRAVENOUS

## 2020-09-22 MED ORDER — SODIUM CHLORIDE 0.9 % IV SOLN: INTRAVENOUS | Status: DC

## 2020-09-22 MED ORDER — CITALOPRAM HYDROBROMIDE 20 MG PO TABS: 10.0000 mg | ORAL_TABLET | Freq: Every day | ORAL | Status: DC

## 2020-09-22 MED ORDER — SIMETHICONE 80 MG PO CHEW
80.0000 mg | CHEWABLE_TABLET | Freq: Four times a day (QID) | ORAL | Status: DC | PRN
  Filled 2020-09-22: qty 1

## 2020-09-22 MED ORDER — BUPIVACAINE HCL (PF) 0.5 % IJ SOLN
INTRAMUSCULAR | Status: AC
  Filled 2020-09-22: qty 30

## 2020-09-22 MED ORDER — BUPIVACAINE LIPOSOME 1.3 % IJ SUSP
INTRAMUSCULAR | Status: AC
  Filled 2020-09-22: qty 20

## 2020-09-22 MED ORDER — FLEET ENEMA 7-19 GM/118ML RE ENEM: 1.0000 | ENEMA | Freq: Once | RECTAL | Status: DC | PRN

## 2020-09-22 MED ORDER — LIDOCAINE 2% (20 MG/ML) 5 ML SYRINGE
INTRAMUSCULAR | Status: DC | PRN
  Administered 2020-09-22: 100 mg via INTRAVENOUS

## 2020-09-22 MED ORDER — IBUPROFEN 800 MG PO TABS: 800.0000 mg | ORAL_TABLET | Freq: Three times a day (TID) | ORAL | Status: DC | PRN

## 2020-09-22 MED ORDER — NORETHIN-ETH ESTRAD-FE BIPHAS 1 MG-10 MCG / 10 MCG PO TABS: 1.0000 | ORAL_TABLET | Freq: Every day | ORAL | Status: DC

## 2020-09-22 MED ORDER — PHENOL 1.4 % MT LIQD: 1.0000 | OROMUCOSAL | Status: DC | PRN

## 2020-09-22 MED ORDER — AMISULPRIDE (ANTIEMETIC) 5 MG/2ML IV SOLN
10.0000 mg | Freq: Once | INTRAVENOUS | Status: AC
  Administered 2020-09-22: 10 mg via INTRAVENOUS

## 2020-09-22 MED ORDER — BUPIVACAINE LIPOSOME 1.3 % IJ SUSP
INTRAMUSCULAR | Status: DC | PRN
  Administered 2020-09-22: 36 mL

## 2020-09-22 MED ORDER — POLYETHYLENE GLYCOL 3350 17 G PO PACK: 17.0000 g | PACK | Freq: Every day | ORAL | Status: DC | PRN

## 2020-09-22 MED ORDER — PROPOFOL 10 MG/ML IV BOLUS
INTRAVENOUS | Status: AC
  Filled 2020-09-22: qty 20

## 2020-09-22 MED ORDER — ACETAMINOPHEN 10 MG/ML IV SOLN
INTRAVENOUS | Status: AC
  Filled 2020-09-22: qty 100

## 2020-09-22 MED ORDER — PRAVASTATIN SODIUM 40 MG PO TABS
40.0000 mg | ORAL_TABLET | Freq: Every day | ORAL | Status: DC
  Administered 2020-09-22: 40 mg via ORAL
  Filled 2020-09-22: qty 1

## 2020-09-22 MED ORDER — AMISULPRIDE (ANTIEMETIC) 5 MG/2ML IV SOLN
INTRAVENOUS | Status: AC
  Filled 2020-09-22: qty 4

## 2020-09-22 MED ORDER — CEFAZOLIN SODIUM-DEXTROSE 2-4 GM/100ML-% IV SOLN
2.0000 g | Freq: Three times a day (TID) | INTRAVENOUS | Status: AC
  Administered 2020-09-22 – 2020-09-23 (×2): 2 g via INTRAVENOUS
  Filled 2020-09-22 (×2): qty 100

## 2020-09-22 MED ORDER — HYDROCODONE-ACETAMINOPHEN 7.5-325 MG PO TABS
1.0000 | ORAL_TABLET | ORAL | Status: DC | PRN
Start: 2020-09-22 — End: 2020-09-23
  Administered 2020-09-22 – 2020-09-23 (×3): 1 via ORAL
  Filled 2020-09-22 (×2): qty 1

## 2020-09-22 MED ORDER — PROMETHAZINE HCL 25 MG/ML IJ SOLN
6.2500 mg | INTRAMUSCULAR | Status: DC | PRN
  Administered 2020-09-22: 6.25 mg via INTRAVENOUS

## 2020-09-22 MED ORDER — ROCURONIUM BROMIDE 10 MG/ML (PF) SYRINGE
PREFILLED_SYRINGE | INTRAVENOUS | Status: DC | PRN
Start: 2020-09-22 — End: 2020-09-22
  Administered 2020-09-22: 60 mg via INTRAVENOUS
  Administered 2020-09-22 (×2): 20 mg via INTRAVENOUS

## 2020-09-22 MED ORDER — ONDANSETRON HCL 4 MG/2ML IJ SOLN
INTRAMUSCULAR | Status: AC
  Filled 2020-09-22: qty 2

## 2020-09-22 MED ORDER — SODIUM CHLORIDE 0.9 % IV SOLN: 250.0000 mL | INTRAVENOUS | Status: DC

## 2020-09-22 MED ORDER — ONDANSETRON HCL 4 MG PO TABS: 4.0000 mg | ORAL_TABLET | Freq: Four times a day (QID) | ORAL | Status: DC | PRN

## 2020-09-22 MED ORDER — SODIUM CHLORIDE 0.9% FLUSH: 3.0000 mL | INTRAVENOUS | Status: DC | PRN

## 2020-09-22 MED ORDER — METHOCARBAMOL 1000 MG/10ML IJ SOLN
500.0000 mg | Freq: Four times a day (QID) | INTRAVENOUS | Status: DC | PRN
  Filled 2020-09-22: qty 5

## 2020-09-22 MED ORDER — KETOROLAC TROMETHAMINE 15 MG/ML IJ SOLN
15.0000 mg | Freq: Four times a day (QID) | INTRAMUSCULAR | Status: DC
  Administered 2020-09-22 – 2020-09-23 (×2): 15 mg via INTRAVENOUS
  Filled 2020-09-22 (×2): qty 1

## 2020-09-22 MED ORDER — PHENYLEPHRINE 40 MCG/ML (10ML) SYRINGE FOR IV PUSH (FOR BLOOD PRESSURE SUPPORT)
PREFILLED_SYRINGE | INTRAVENOUS | Status: AC
  Filled 2020-09-22: qty 10

## 2020-09-22 MED ORDER — DIAZEPAM 5 MG PO TABS
5.0000 mg | ORAL_TABLET | Freq: Two times a day (BID) | ORAL | Status: DC | PRN
  Administered 2020-09-23: 5 mg via ORAL
  Filled 2020-09-22: qty 1

## 2020-09-22 MED ORDER — BISACODYL 5 MG PO TBEC: 5.0000 mg | DELAYED_RELEASE_TABLET | Freq: Every day | ORAL | Status: DC | PRN

## 2020-09-22 MED ORDER — LACTATED RINGERS IV SOLN: INTRAVENOUS | Status: DC

## 2020-09-22 MED ORDER — THROMBIN 20000 UNITS EX SOLR
CUTANEOUS | Status: AC
  Filled 2020-09-22: qty 20000

## 2020-09-22 MED ORDER — DOCUSATE SODIUM 100 MG PO CAPS
100.0000 mg | ORAL_CAPSULE | Freq: Two times a day (BID) | ORAL | Status: DC
  Administered 2020-09-22: 100 mg via ORAL
  Filled 2020-09-22: qty 1

## 2020-09-22 MED ORDER — ACETAMINOPHEN 650 MG RE SUPP: 650.0000 mg | RECTAL | Status: DC | PRN

## 2020-09-22 MED ORDER — LIDOCAINE 2% (20 MG/ML) 5 ML SYRINGE
INTRAMUSCULAR | Status: AC
  Filled 2020-09-22: qty 15

## 2020-09-22 MED ORDER — MIDAZOLAM HCL 2 MG/2ML IJ SOLN
INTRAMUSCULAR | Status: AC
  Filled 2020-09-22: qty 2

## 2020-09-22 MED ORDER — CEFAZOLIN SODIUM-DEXTROSE 2-4 GM/100ML-% IV SOLN
2.0000 g | INTRAVENOUS | Status: AC
  Administered 2020-09-22: 2 g via INTRAVENOUS
  Filled 2020-09-22: qty 100

## 2020-09-22 MED ORDER — EPHEDRINE 5 MG/ML INJ
INTRAVENOUS | Status: AC
  Filled 2020-09-22: qty 5

## 2020-09-22 MED ORDER — FENTANYL CITRATE (PF) 250 MCG/5ML IJ SOLN
INTRAMUSCULAR | Status: AC
  Filled 2020-09-22: qty 5

## 2020-09-22 MED ORDER — MIDAZOLAM HCL 2 MG/2ML IJ SOLN
INTRAMUSCULAR | Status: DC | PRN
  Administered 2020-09-22: 2 mg via INTRAVENOUS

## 2020-09-22 MED ORDER — PHENYLEPHRINE 40 MCG/ML (10ML) SYRINGE FOR IV PUSH (FOR BLOOD PRESSURE SUPPORT)
PREFILLED_SYRINGE | INTRAVENOUS | Status: DC | PRN
  Administered 2020-09-22: 80 ug via INTRAVENOUS
  Administered 2020-09-22 (×2): 60 ug via INTRAVENOUS
  Administered 2020-09-22 (×2): 80 ug via INTRAVENOUS

## 2020-09-22 MED ORDER — HYDROMORPHONE HCL 1 MG/ML IJ SOLN
0.2500 mg | INTRAMUSCULAR | Status: DC | PRN
  Administered 2020-09-22 (×4): 0.5 mg via INTRAVENOUS

## 2020-09-22 MED ORDER — ALUM & MAG HYDROXIDE-SIMETH 200-200-20 MG/5ML PO SUSP: 30.0000 mL | Freq: Four times a day (QID) | ORAL | Status: DC | PRN

## 2020-09-22 MED ORDER — ONDANSETRON HCL 4 MG/2ML IJ SOLN
INTRAMUSCULAR | Status: DC | PRN
  Administered 2020-09-22: 4 mg via INTRAVENOUS

## 2020-09-22 MED ORDER — CHLORHEXIDINE GLUCONATE 0.12 % MT SOLN: 15.0000 mL | Freq: Once | OROMUCOSAL | Status: AC

## 2020-09-22 MED ORDER — DEXAMETHASONE SODIUM PHOSPHATE 10 MG/ML IJ SOLN
INTRAMUSCULAR | Status: DC | PRN
  Administered 2020-09-22: 10 mg via INTRAVENOUS

## 2020-09-22 MED ORDER — LORATADINE 10 MG PO TABS
10.0000 mg | ORAL_TABLET | Freq: Every day | ORAL | Status: DC
  Administered 2020-09-22: 10 mg via ORAL
  Filled 2020-09-22: qty 1

## 2020-09-22 MED ORDER — DEXAMETHASONE SODIUM PHOSPHATE 10 MG/ML IJ SOLN
INTRAMUSCULAR | Status: AC
  Filled 2020-09-22: qty 1

## 2020-09-22 MED ORDER — ORAL CARE MOUTH RINSE: 15.0000 mL | Freq: Once | OROMUCOSAL | Status: AC

## 2020-09-22 MED ORDER — ADULT MULTIVITAMIN W/MINERALS CH: ORAL_TABLET | Freq: Every day | ORAL | Status: DC

## 2020-09-22 MED ORDER — HYDROCODONE-ACETAMINOPHEN 7.5-325 MG PO TABS
1.0000 | ORAL_TABLET | Freq: Four times a day (QID) | ORAL | Status: DC
  Administered 2020-09-23: 1 via ORAL
  Filled 2020-09-22 (×2): qty 1

## 2020-09-22 MED ORDER — 0.9 % SODIUM CHLORIDE (POUR BTL) OPTIME
TOPICAL | Status: DC | PRN
  Administered 2020-09-22 (×2): 1000 mL

## 2020-09-22 MED ORDER — CHLORHEXIDINE GLUCONATE 0.12 % MT SOLN
OROMUCOSAL | Status: AC
  Administered 2020-09-22: 15 mL via OROMUCOSAL
  Filled 2020-09-22: qty 15

## 2020-09-22 MED ORDER — ONDANSETRON HCL 4 MG/2ML IJ SOLN: 4.0000 mg | Freq: Four times a day (QID) | INTRAMUSCULAR | Status: DC | PRN

## 2020-09-22 MED ORDER — SUGAMMADEX SODIUM 200 MG/2ML IV SOLN
INTRAVENOUS | Status: DC | PRN
  Administered 2020-09-22: 150 mg via INTRAVENOUS

## 2020-09-22 SURGICAL SUPPLY — 53 items
ADH SKN CLS APL DERMABOND .7 (GAUZE/BANDAGES/DRESSINGS) ×1
BAG COUNTER SPONGE SURGICOUNT (BAG) ×2 IMPLANT
BAG SPNG CNTER NS LX DISP (BAG) ×1
BUR SABER RD CUTTING 3.0 (BURR) IMPLANT
CANISTER SUCT 3000ML PPV (MISCELLANEOUS) ×2 IMPLANT
COVER SURGICAL LIGHT HANDLE (MISCELLANEOUS) ×2 IMPLANT
DERMABOND ADVANCED (GAUZE/BANDAGES/DRESSINGS) ×1
DERMABOND ADVANCED .7 DNX12 (GAUZE/BANDAGES/DRESSINGS) ×1 IMPLANT
DRAPE HALF SHEET 40X57 (DRAPES) ×2 IMPLANT
DRAPE INCISE IOBAN 66X45 STRL (DRAPES) IMPLANT
DRAPE MICROSCOPE LEICA (MISCELLANEOUS) ×2 IMPLANT
DRAPE SURG 17X23 STRL (DRAPES) ×8 IMPLANT
DRESSING MEPILEX FLEX 4X4 (GAUZE/BANDAGES/DRESSINGS) IMPLANT
DRSG MEPILEX BORDER 4X8 (GAUZE/BANDAGES/DRESSINGS) IMPLANT
DRSG MEPILEX FLEX 4X4 (GAUZE/BANDAGES/DRESSINGS) ×4
DURAPREP 26ML APPLICATOR (WOUND CARE) ×2 IMPLANT
ELECT REM PT RETURN 9FT ADLT (ELECTROSURGICAL) ×2
ELECTRODE REM PT RTRN 9FT ADLT (ELECTROSURGICAL) ×1 IMPLANT
EVACUATOR 1/8 PVC DRAIN (DRAIN) IMPLANT
GAUZE 4X4 16PLY ~~LOC~~+RFID DBL (SPONGE) ×2 IMPLANT
GLOVE SRG 8 PF TXTR STRL LF DI (GLOVE) ×1 IMPLANT
GLOVE SURG 8.5 LATEX PF (GLOVE) ×2 IMPLANT
GLOVE SURG LTX SZ9 (GLOVE) ×2 IMPLANT
GLOVE SURG ORTHO LTX SZ7.5 (GLOVE) ×2 IMPLANT
GLOVE SURG UNDER POLY LF SZ8 (GLOVE) ×2
GOWN STRL REUS W/ TWL LRG LVL3 (GOWN DISPOSABLE) ×1 IMPLANT
GOWN STRL REUS W/TWL 2XL LVL3 (GOWN DISPOSABLE) ×4 IMPLANT
GOWN STRL REUS W/TWL LRG LVL3 (GOWN DISPOSABLE) ×2
KIT BASIN OR (CUSTOM PROCEDURE TRAY) ×2 IMPLANT
KIT TURNOVER KIT B (KITS) ×2 IMPLANT
NDL SPNL 18GX3.5 QUINCKE PK (NEEDLE) ×2 IMPLANT
NEEDLE SPNL 18GX3.5 QUINCKE PK (NEEDLE) ×4 IMPLANT
NS IRRIG 1000ML POUR BTL (IV SOLUTION) ×2 IMPLANT
PACK LAMINECTOMY ORTHO (CUSTOM PROCEDURE TRAY) ×2 IMPLANT
PAD ARMBOARD 7.5X6 YLW CONV (MISCELLANEOUS) ×4 IMPLANT
PATTIES SURGICAL .5 X.5 (GAUZE/BANDAGES/DRESSINGS) ×1 IMPLANT
PATTIES SURGICAL .75X.75 (GAUZE/BANDAGES/DRESSINGS) IMPLANT
PATTIES SURGICAL 1X1 (DISPOSABLE) ×1 IMPLANT
SPONGE SURGIFOAM ABS GEL 100 (HEMOSTASIS) ×1 IMPLANT
SPONGE T-LAP 4X18 ~~LOC~~+RFID (SPONGE) ×2 IMPLANT
SUT VIC AB 0 CT1 27 (SUTURE)
SUT VIC AB 0 CT1 27XBRD ANBCTR (SUTURE) IMPLANT
SUT VIC AB 1 CT1 27 (SUTURE)
SUT VIC AB 1 CT1 27XBRD ANBCTR (SUTURE) IMPLANT
SUT VIC AB 2-0 CT1 27 (SUTURE) ×2
SUT VIC AB 2-0 CT1 TAPERPNT 27 (SUTURE) IMPLANT
SUT VIC AB 2-0 CT2 27 (SUTURE) ×2 IMPLANT
SUT VIC AB 3-0 X1 27 (SUTURE) ×2 IMPLANT
SUT VICRYL 0 UR6 27IN ABS (SUTURE) ×3 IMPLANT
TOWEL GREEN STERILE (TOWEL DISPOSABLE) ×2 IMPLANT
TOWEL GREEN STERILE FF (TOWEL DISPOSABLE) ×2 IMPLANT
TRAY FOLEY MTR SLVR 16FR STAT (SET/KITS/TRAYS/PACK) ×1 IMPLANT
WATER STERILE IRR 1000ML POUR (IV SOLUTION) ×2 IMPLANT

## 2020-09-22 NOTE — Anesthesia Postprocedure Evaluation (Signed)
Anesthesia Post Note  Patient: Wendy Deleon  Procedure(s) Performed: MICRODISCECTOMY LEFT L2-3, BIILATERAL MICRODISCECTOMY L4-5 AND L5-S1 (Spine Lumbar)     Patient location during evaluation: PACU Anesthesia Type: General Level of consciousness: awake and alert Pain management: pain level controlled Vital Signs Assessment: post-procedure vital signs reviewed and stable Respiratory status: spontaneous breathing, nonlabored ventilation, respiratory function stable and patient connected to nasal cannula oxygen Cardiovascular status: blood pressure returned to baseline and stable Postop Assessment: no apparent nausea or vomiting Anesthetic complications: no   No notable events documented.  Last Vitals:  Vitals:   09/22/20 2023 09/22/20 2038  BP: (!) 104/59 121/66  Pulse: 69 69  Resp: 12 12  Temp:  (!) 36.3 C  SpO2: 93% 96%    Last Pain:  Vitals:   09/22/20 2023  TempSrc:   PainSc: 8                  Amier Hoyt S

## 2020-09-22 NOTE — Interval H&P Note (Signed)
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  The various methods of treatment have been discussed with the patient and family. After consideration of risks, benefits and other options for treatment, the patient has consented to  Procedure(s): MICRODISCECTOMY LEFT L2-3, BIILATERAL MICRODISCECTOMY L4-5 AND L5-S1 (N/A) as a surgical intervention.  The patient's history has been reviewed, patient examined, no change in status, stable for surgery.  I have reviewed the patient's chart and labs.  Questions were answered to the patient's satisfaction.     Vira Browns

## 2020-09-22 NOTE — Op Note (Signed)
09/22/2020  7:21 PM  PATIENT:  Wendy Deleon  50 y.o. 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 Dr. Daiva Huge.   DRAINS: Foley to SD. This removed prior to extubation.    EBL: 100cc.     COMPLICATIONS:  None.   PROCEDURE:The patient was met in the holding area, and the appropriate lumbar level Leftt L2-3 and bilateral L5-S1 and L4-5 identified and marked with "x" and my initials.The patient was then transported to OR and was placed under general anesthesia without difficulty. The patient received appropriate preoperative antibiotic prophylaxis ancef 2 gm. Foley catheter was placed sterilely. The patient after intubation atraumatically was transferred to the operating room table, prone position, Wilson frame, sliding OR table. All pressure points were well padded. The arms in 90-90 well-padded at the elbows. Standard prep with DuraPrep solution lower dorsal spine to the mid sacral segment. Draped in the usual manner iodine Vi-Drape was used. Time-out procedure was called and correct. 2x 18-gauge spinal needle was then inserted at the expected L2-3 and L5-S1 level. Cross table lateral radiograph used to identify the spinal needles positions. The lower needle was at the lower aspect of the lamina of L5. The superior needle was at  the posterior lamina of L3. Skin superior to this superior needle was then infiltrated with MARCAINE1/2% 1:1 EXPAREL 1.3%  total of 10 cc used.  And the skin over the lower lumbar spine also infiltrated with 10cc of marcaine/exparel.  An incision approximately an inch inch and a half in length was then made through skin and subcutaneous layers in line with the midline just superior to the upper spinal needle entry point. A kocher clamp placed on the inferior aspect of the L2 spinous process and the mid portion of the L5 spinous process. Intraoperative lateral radiograph demonstrated the  Clamps at the L2 inferior spinous process and the lower clamp at the L5 spinous process. There were marked with single sutures of 2-0 vicryl for continued identification through out the case. The upper incision then carried down to the left L2-3 interlaminar area posteriorly. The depth measured off of the Cobb elevator at about 50 mm and 50 mm Boss McCollough retractors and placed on the scaffolding and guided down to and docking on the posterior aspect of the lamina at the expected L2-3 level. This was sterilely attached to the articulating arm and it's up right which had been attached the OR table sterilely.  1/4 inch osteotome used to remove a small amount of the medial aspect of the inferior articular process of L4 approximately 10% using the Kerrison to debris the bone and the flavum attachment as a curet. Foraminotomy was then performed over the L3 nerve root. The medial 10% superior articular process of L3 and then resected using an osteotome and 3 mm Kerrison. This allowed for identification of the thecal sac. Penfield 4 was then used to carefully mobilize the thecal  sac medially and the L3 nerve root identified within the lateral recess. Carefully the lateral aspect of the L3 nerve root was identified and a Penfield 4 was used to mobilize the nerve medially such that the L2-3 disc was visible with microscope. Using a  nerve hook nerve root and then more easily able to be mobilized medially and retracted using a derricho retractor. Further foraminotomies was performed over the L2 nerve root the nerve root was noted to be decompressed. The nerve root able to be retracted along the medial aspect of the L3 pedicle no disc herniated at this level. Ligamentum flavum was further debrided superiorly to the level L2-3 disc. Had a moderate amount of further resection of the L2 lamina inferiorly was performed. With this then the disc space at L2-3 was easily visualized  Ligamentum flavum was debrided and lateral recess along the medial aspect L2-3 facet no further decompression was necessary. Ball tip nerve probe was then able to carefully palpate the neuroforamen for L2 and L3 finding these to be well decompressed.Thrombin soaked gel foam and cottonoid then placed.   An incision made into the right and left lumbosacral fascia at the identified L5 level extending downwards exposing the left first L5-S1 interspace and the the right L5-S1 interspace approximately an inch and 1/2 in length .  The depth measured off of the Cobb elevator at about 60 mm and 60 mm retractors and placed on the scaffolding and guided down to and docking on the posterior aspect of the lamina at the expected L5-S1 level. Using OR microscope magnification the left L5-S1 interspace carefully debrided the small amount of muscle attachment here and high-speed bur used to drill the medial aspect of the inferior articular process of L5 and L4 approximately 10%.3 mm Kerrison then used to enter the spinal canal over the superior aspect of the S1 lamina carefully using the Kerrison to debris the attachment as a curet. Foraminotomy was then performed over the left S1 nerve root. The medial 10% superior articular process of S1 and then resected using an osteotome and 2 mm Kerrison. This allowed for identification of the thecal sac, the left lateral recess at L5-S1. The medial  facet debrided of ligamentum flavum attachment and the left L5 and SI neuroforamen well decompressed. The left L5-S1 disc was bulging but not herniated the left L5 neuroforamen and the left S1 nerve root were compressed by hypertrophic facet and flavum. Attention then turned to the right L5-S1 level which was easily visualized with loop magnification and the MIS retractors. Soft tissues debrided about the right L4-5 and L5-S1 area over the posterior aspect of the L4-5 and L5-S1 interspace. High-speed bur and then used to carefully drill inferior 3 or 4 mm of the right side L5 lamina and on the medial aspect of the left L5 inferior articular process of 3 mm. The superior margin of the S1 lamina then carefully debrided with curette and 20m kerrison then used to enter the spinal canal over the superior aspect of the S1 lamina resecting bone over the superior aspect and freeing up the attachment of ligamentum flavum here. Ligamentum flavum then debrided with the 3 mm Kerrison right S1 nerve root and the lateral recess decompressed using  3 mm and 4 mm Kerrisons excising hypertrophic reflected ligamentum flavum extending superiorly. From was resected off the ventral aspect of the inferior margin of the L5 lamina. Hockey-stick nerve probe could then be passed out the L5 neuroforamen and the  S1 neuroforamen. Venous  bleeding encountered.  Irrigation was carried out down to this bleeding controlled with Gelfoam. Gelfoam was then removed. Irrigation and careful examination demonstrated no active bleeding present. Using sterilely draped OR microscope for magnification the right L5-S1 protruded herniated disc was identified and incised with 15 blade a long handled scalpel. With this disc material extruded and the MIS pituitary rongeurs were used to debride the disc of degenerated and herniated disc material centrally. Care was taken to retract the S1 nerve root lateral  thecal sac allowing for the disc to be excised across the  midline to the left side as well as right side. Up-biting pituitary rongeurs were used and the disc space debrided until no further loose fragments were found to be present. A Woodson retractor was used to examine the spinal canal and the area posterior to the L5-S1 disc space showed no significant remaining disc protrusion however a central median osteophyte appeared to be present which was left in place. The S1 and L5 neuroforamen probe freely with no sign remaining nerve compression. Attention then turned to the right L4-5 level which was easily visualized with OR microscope magnification and the Boss retractors. Soft tissues debrided about the right L4-5 area over the posterior aspect of the L4-5 interspace. High-speed bur and then used to carefully drill inferior 3 or 4 mm of the right side L4 lamina and on the medial aspect of the left L4 inferior articular process of 3 mm. The superior margin of the L5 lamina then carefully debrided with curette and 104m kerrison then used to enter the spinal canal over the superior aspect of the L5 lamina resecting bone over the superior aspect and freeing up the attachment of ligamentum flavum here. Ligamentum flavum then debrided with the 3 mm Kerrison left L5 nerve root and the lateral recess decompressed using 2 and 3 mm Kerrisons sizing hypertrophic reflected ligamentum flavum extending superiorly. From was resected off the ventral aspect of the inferior margin of the L4 lamina. Hockey-stick nerve probe could then be passed out the right L4 neuroforamen and the right L5 neuroforamen. Venous bleeding encountered. Thrombin-soaked Gelfoam used to control this following this then the sac and the L5 nerve root were mobilized medially and the L4-5 disc examined and found not to be herniated. The L4-5 disc space showed retrolisthesis of L4 on L5 with a median osteophyte lateral recess stenosis. Lateral recess decompression was carried out. Retractors were then carefully removed  bleeding was then controlled using bipolar electrocautery. Small amount of bleeding within the soft tissue mass the laminotomy area was controlled using bipolar electrocautery. The cause of this patient's pain appeared to be primarily the pathology at the L%-S1 level with the central disc herniation and bilateral lateral recess narrowing.The left L4-5 side was evaluated from the right L4-5 laminectomy and not felt to represent significant cause of nerve compression. The left L2-3 and L5-S1disc explored and showed no signs of disc herniation and again a median osteophyte was determined to be present. The left L2, L3, L5 and S1 neuroforamen probe to and showed no sign of nerve root compression. The right L4, L5 and S1 neuroforamen were probed and showed no remaining nerve compression. Irrigation was carried out using copious amounts of irrigant solution. All Gelfoam were then removed. No significant active bleeding present at the time of removal. All instruments sponge counts were correct traction system was then carefully removed carefully rotating retractors with this withdrawal and only bipolar electrocautery of any small bleeders.Gelfoam foam was removed from  the right and left sides and irrigation was performed. No active bleeding was present. Both the upper left L2-3 and the lower L4 to S1 incisions closed with approximation of the lumbodorsal fascia with 0 Vicryl suture reattaching to the spinous processes at each level left L2-3 and right L4-5 and bilateral L5-S1. Subcutaneous tissue and fascia further infiltrated with  MARCAINE1/2% 1:1 EXPAREL 1.3%, total amount: 36 ml. Deep subcutaneous layers were approximated with interrupted 0 Vicryl sutures on UR 6 the appear subcutaneous layers approximated with interrupted 2-0 Vicryl sutures and the skin closed with a running subcutaneous stitch of 3-0 Vicryl. Dermabond was applied allowed to dry and then Mepilex bandage applied. Patient was then carefully returned to  supine position on a stretcher, reactivated and extubated. Foley catheter placed at the beginning of anethesia was removed prior to extubation. She was then returned to recovery room in satisfactory condition.   Benjiman Core PA-C perform the duties of assistant surgeon during this case. He was present from the beginning of the case to the end of the case assisting in transfer the patient from his stretcher to the OR table and back to the stretcher at the end of the case. Assisted in careful retraction and suction of the laminectomy site delicate neural structures operating under the operating room microscope. He performed closure of the incision from the fascia to the skin applying the dressing.     Basil Dess  09/22/2020, 7:21 PM

## 2020-09-22 NOTE — Transfer of Care (Signed)
Immediate Anesthesia Transfer of Care Note  Patient: Wendy Deleon  Procedure(s) Performed: MICRODISCECTOMY LEFT L2-3, BIILATERAL MICRODISCECTOMY L4-5 AND L5-S1 (Spine Lumbar)  Patient Location: PACU  Anesthesia Type:General  Level of Consciousness: awake, alert  and oriented  Airway & Oxygen Therapy: Patient Spontanous Breathing  Post-op Assessment: Report given to RN and Post -op Vital signs reviewed and stable  Post vital signs: Reviewed and stable  Last Vitals:  Vitals Value Taken Time  BP 136/71 09/22/20 1923  Temp    Pulse 95 09/22/20 1928  Resp 12 09/22/20 1928  SpO2 97 % 09/22/20 1928  Vitals shown include unvalidated device data.  Last Pain:  Vitals:   09/22/20 1329  TempSrc:   PainSc: 2          Complications: No notable events documented.

## 2020-09-22 NOTE — Anesthesia Procedure Notes (Signed)
Procedure Name: Intubation Date/Time: 09/22/2020 3:46 PM Performed by: Marena Chancy, CRNA Pre-anesthesia Checklist: Patient identified, Emergency Drugs available, Suction available and Patient being monitored Patient Re-evaluated:Patient Re-evaluated prior to induction Oxygen Delivery Method: Circle System Utilized Preoxygenation: Pre-oxygenation with 100% oxygen Induction Type: IV induction Ventilation: Mask ventilation without difficulty Laryngoscope Size: Miller and 2 Grade View: Grade I Tube type: Oral Tube size: 7.0 mm Number of attempts: 1 Airway Equipment and Method: Stylet and Oral airway Placement Confirmation: ETT inserted through vocal cords under direct vision, positive ETCO2 and breath sounds checked- equal and bilateral Tube secured with: Tape Dental Injury: Teeth and Oropharynx as per pre-operative assessment

## 2020-09-22 NOTE — Brief Op Note (Signed)
09/22/2020  7:18 PM  PATIENT:  Wendy Deleon  50 y.o. 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, bilateral L4-5 herniated nucleus polposus, central L5-S1 herniated nucleus polposus  POST-OPERATIVE DIAGNOSIS:  eft L2-3 herniated nucleus polposus, bilateral L4-5 herniated nucleus polposus, central L5-S1 herniated nucleus polposus  PROCEDURE:  Procedure(s): MICRODISCECTOMY LEFT L2-3, BIILATERAL MICRODISCECTOMY L4-5 AND L5-S1 (N/A)  SURGEON:  Surgeon(s) and Role:    * Kerrin Champagne, MD - Primary  PHYSICIAN ASSISTANT: Zonia Kief, [facility]  ANESTHESIA:   local and general  EBL:  100 mL   BLOOD ADMINISTERED:none  DRAINS: Urinary Catheter (Foley) Removed at the end of the case  LOCAL MEDICATIONS USED:  MARCAINE 0.5% 1:1 Exparel 1.3%  Amount: 36 ml  SPECIMEN:  No Specimen  DISPOSITION OF SPECIMEN:  N/A  COUNTS:  YES  TOURNIQUET:  * No tourniquets in log *  DICTATION: .Dragon Dictation  PLAN OF CARE: Admit for overnight observation  PATIENT DISPOSITION:  PACU - hemodynamically stable.   Delay start of Pharmacological VTE agent (>24hrs) due to surgical blood loss or risk of bleeding: yes

## 2020-09-22 NOTE — Discharge Instructions (Addendum)
    No lifting greater than 10 lbs. Avoid bending, stooping and twisting. Walk in house for first week them may start to get out slowly increasing distance up to one mile by 6 weeks post op. Keep incision dry for 3 days, may use tegaderm or similar water impervious dressing.

## 2020-09-23 ENCOUNTER — Encounter (HOSPITAL_COMMUNITY): Payer: Self-pay | Admitting: Specialist

## 2020-09-23 DIAGNOSIS — M5116 Intervertebral disc disorders with radiculopathy, lumbar region: Secondary | ICD-10-CM | POA: Diagnosis not present

## 2020-09-23 LAB — CBC
HCT: 35.5 % — ABNORMAL LOW (ref 36.0–46.0)
Hemoglobin: 12.2 g/dL (ref 12.0–15.0)
MCH: 32.5 pg (ref 26.0–34.0)
MCHC: 34.4 g/dL (ref 30.0–36.0)
MCV: 94.7 fL (ref 80.0–100.0)
Platelets: 184 10*3/uL (ref 150–400)
RBC: 3.75 MIL/uL — ABNORMAL LOW (ref 3.87–5.11)
RDW: 11.2 % — ABNORMAL LOW (ref 11.5–15.5)
WBC: 14.7 10*3/uL — ABNORMAL HIGH (ref 4.0–10.5)
nRBC: 0 % (ref 0.0–0.2)

## 2020-09-23 MED ORDER — METHOCARBAMOL 500 MG PO TABS: 500.0000 mg | ORAL_TABLET | Freq: Four times a day (QID) | ORAL | 1 refills | Status: DC | PRN

## 2020-09-23 MED ORDER — HYDROCODONE-ACETAMINOPHEN 7.5-325 MG PO TABS: 1.0000 | ORAL_TABLET | ORAL | 0 refills | Status: DC | PRN

## 2020-09-23 MED ORDER — DOCUSATE SODIUM 100 MG PO CAPS: 100.0000 mg | ORAL_CAPSULE | Freq: Two times a day (BID) | ORAL | 0 refills | Status: DC

## 2020-09-23 NOTE — Evaluation (Signed)
Occupational Therapy Evaluation Patient Details Name: Wendy Deleon MRN: [id number] DOB: 01-12-1971 Today's Date: 09/23/2020    History of Present Illness Pt is a 50yo female who underwent elective L L2-3 microdiscetomy and bilat microdiscectomy L4-5 and L5-S1 due to low backp ain and LE radiculopathy.  PMH: anxiety, PSH: R hand surgery   Clinical Impression   Patient admitted for the above diagnosis and procedure.  PTA she lives with her family, and has all needed assist if needed.  Mild back discomfort, but no other deficits noted.  She has a thorough understanding of all precautions and movement restrictions.  No needs post acute are anticipated.      Follow Up Recommendations  No OT follow up    Equipment Recommendations  None recommended by OT    Recommendations for Other Services       Precautions / Restrictions Precautions Precautions: Back Precaution Booklet Issued: Yes (comment) Precaution Comments: pt and spouse with good understanding Restrictions Weight Bearing Restrictions: No Other Position/Activity Restrictions: lifting restrictions      Mobility Bed Mobility Overal bed mobility: Modified Independent             General bed mobility comments: up in recliner Patient Response: Cooperative  Transfers Overall transfer level: Needs assistance Equipment used: Rolling walker (2 wheeled) Transfers: Sit to/from Stand Sit to Stand: Supervision         General transfer comment: up and walking the halls without issue    Balance Overall balance assessment: Mild deficits observed, not formally tested                                         ADL either performed or assessed with clinical judgement   ADL Overall ADL's : At baseline                                             Vision Patient Visual Report: No change from baseline       Perception     Praxis      Pertinent Vitals/Pain Pain Assessment:  Faces Pain Score: 4  Faces Pain Scale: Hurts a little bit Pain Location: back Pain Descriptors / Indicators: Discomfort;Tender Pain Intervention(s): Monitored during session     Hand Dominance Right   Extremity/Trunk Assessment Upper Extremity Assessment Upper Extremity Assessment: RUE deficits/detail RUE Deficits / Details: R fingers fused RUE Sensation: decreased light touch (R thumb) RUE Coordination: decreased fine motor   Lower Extremity Assessment Lower Extremity Assessment: Generalized weakness   Cervical / Trunk Assessment Cervical / Trunk Assessment: Other exceptions Cervical / Trunk Exceptions: recent back surgery   Communication Communication Communication: No difficulties   Cognition Arousal/Alertness: Awake/alert Behavior During Therapy: WFL for tasks assessed/performed Overall Cognitive Status: Within Functional Limits for tasks assessed                                     General Comments  N/a    Exercises Exercises: Other exercises Other Exercises Other Exercises: educated on isometric abdominal holds for core strengthening in supine/hooklying position and sitting   Shoulder Instructions      Home Living Family/patient expects to be discharged to:: Private residence Living Arrangements: Spouse/significant other  Available Help at Discharge: Family;Available 24 hours/day Type of Home: House Home Access: Level entry     Home Layout: One level     Bathroom Shower/Tub: Walk-in shower;Tub/shower unit   Bathroom Toilet: Handicapped height     Home Equipment: Environmental consultant - 2 wheels;Cane - single point;Adaptive equipment Adaptive Equipment: Reacher;Long-handled shoe horn;Long-handled sponge        Prior Functioning/Environment Level of Independence: Independent with assistive device(s)        Comments: use of cane for mobility        OT Problem List: Pain      OT Treatment/Interventions:      OT Goals(Current goals can be  found in the care plan section) Acute Rehab OT Goals Patient Stated Goal: Return home and recover OT Goal Formulation: With patient Time For Goal Achievement: 09/23/20 Potential to Achieve Goals: Good  OT Frequency:     Barriers to D/C:            Co-evaluation              AM-PAC OT "6 Clicks" Daily Activity     Outcome Measure Help from another person eating meals?: None Help from another person taking care of personal grooming?: None Help from another person toileting, which includes using toliet, bedpan, or urinal?: None Help from another person bathing (including washing, rinsing, drying)?: None Help from another person to put on and taking off regular upper body clothing?: None Help from another person to put on and taking off regular lower body clothing?: None 6 Click Score: 24   End of Session Nurse Communication: Mobility status  Activity Tolerance: Patient tolerated treatment well Patient left: in chair;with call bell/phone within reach;with family/visitor present  OT Visit Diagnosis: Pain                Time: 0981-1914 OT Time Calculation (min): 19 min Charges:  OT General Charges $OT Visit: 1 Visit OT Evaluation $OT Eval Moderate Complexity: 1 Mod  09/23/2020  Rich, OTR/L  Acute Rehabilitation Services  Office:  [phone]   Wendy Deleon 09/23/2020, 9:12 AM

## 2020-09-23 NOTE — Progress Notes (Signed)
     Subjective: 1 Day Post-Op Procedure(s) (LRB): MICRODISCECTOMY LEFT L2-3, BIILATERAL MICRODISCECTOMY L4-5 AND L5-S1 (N/A) Awake, alert and oriented x 4. Voiding well. Walking in the hallway with decreased right leg pain. Some residual numbness but overall Much  more comfortable.  "When can I go home?" Patient reports pain as moderate.    Objective:   VITALS:  Temp:  [97.3 F (36.3 C)-98.6 F (37 C)] 98.6 F (37 C) (08/13 0807) Pulse Rate:  [59-89] 63 (08/13 0807) Resp:  [12-18] 16 (08/13 0807) BP: (104-155)/(58-71) 132/64 (08/13 0807) SpO2:  [93 %-100 %] 99 % (08/13 0807) Weight:  [74.8 kg] 74.8 kg (08/12 1303)  Neurologically intact ABD soft Neurovascular intact Sensation intact distally Intact pulses distally Dorsiflexion/Plantar flexion intact Incision: dressing C/D/I and no drainage   LABS Recent Labs    09/23/20 0630  HGB 12.2  WBC 14.7*  PLT 184   No results for input(s): NA, K, CL, CO2, BUN, CREATININE, GLUCOSE in the last 72 hours. No results for input(s): LABPT, INR in the last 72 hours.   Assessment/Plan: 1 Day Post-Op Procedure(s) (LRB): MICRODISCECTOMY LEFT L2-3, BIILATERAL MICRODISCECTOMY L4-5 AND L5-S1 (N/A)  Advance diet Up with therapy Discharge home with home health  Vira Browns 09/23/2020, 11:33 AM Patient ID: Wendy Deleon, female   DOB: 06-Feb-1971, 50 y.o.   MRN: [id number]

## 2020-09-23 NOTE — Evaluation (Signed)
Physical Therapy Evaluation and DISCHARGE Patient Details Name: Wendy Deleon MRN: [id number] DOB: 11/13/1970 Today's Date: 09/23/2020   History of Present Illness  Pt is a 50yo female who underwent elective L L2-3 micordiscetomy and bilat microdiscectomy L4-5 and L5-S1 due to low backp ain and LE radiculopathy.  PMH: anxiety, PSH: R hand surgery   Clinical Impression  Pt admitted for above. Pt functioning at supervision with minimal pain, good understanding and carry over of back precautions, and tolerating amb >200' with RW. Pt with good home set up and support. Pt with no further acute PT needs at this time. PT SIGNING OFF. Please re-consult if needed in future.    Follow Up Recommendations No PT follow up    Equipment Recommendations  None recommended by PT    Recommendations for Other Services       Precautions / Restrictions Precautions Precautions: Back Precaution Booklet Issued: Yes (comment) Precaution Comments: pt and spouse with good understanding Restrictions Weight Bearing Restrictions: No Other Position/Activity Restrictions: lifting restrictions      Mobility  Bed Mobility Overal bed mobility: Modified Independent             General bed mobility comments: verbal cues for log roll technique, bed flat, no use of bed rail to mimic home set up    Transfers Overall transfer level: Needs assistance Equipment used: Rolling walker (2 wheeled) Transfers: Sit to/from Stand Sit to Stand: Supervision         General transfer comment: verbal cues for proper hand placement, increased time, guarded, minimal trunk flexion  Ambulation/Gait Ambulation/Gait assistance: Supervision Gait Distance (Feet): 250 Feet Assistive device: Rolling walker (2 wheeled) Gait Pattern/deviations: Step-through pattern;Decreased stride length Gait velocity: dec, guarded   General Gait Details: verbal cues to relax shoulders, fluid, steady gait, no episodes of  LOB  Stairs            Wheelchair Mobility    Modified Rankin (Stroke Patients Only)       Balance Overall balance assessment: Mild deficits observed, not formally tested                                           Pertinent Vitals/Pain Pain Assessment: 0-10 Pain Score: 4  Pain Location: back Pain Descriptors / Indicators: Discomfort Pain Intervention(s): Premedicated before session    Home Living Family/patient expects to be discharged to:: Private residence Living Arrangements: Spouse/significant other Available Help at Discharge: Family;Available 24 hours/day Type of Home: House Home Access: Level entry     Home Layout: One level Home Equipment: Walker - 2 wheels;Cane - single point      Prior Function Level of Independence: Independent         Comments: reports using straight cane daily, occasionally RW when her R LE would give out     Hand Dominance        Extremity/Trunk Assessment   Upper Extremity Assessment Upper Extremity Assessment: Defer to OT evaluation (R fingers fused together from previous surgery due to injury)    Lower Extremity Assessment Lower Extremity Assessment: Generalized weakness    Cervical / Trunk Assessment Cervical / Trunk Assessment: Other exceptions Cervical / Trunk Exceptions: recent back surgery  Communication   Communication: No difficulties  Cognition Arousal/Alertness: Awake/alert Behavior During Therapy: WFL for tasks assessed/performed Overall Cognitive Status: Within Functional Limits for tasks assessed  General Comments General comments (skin integrity, edema, etc.): incision covered by dressing, denies dizziness with mobility, VSS    Exercises Other Exercises Other Exercises: educated on isometric abdominal holds for core strengthening in supine/hooklying position and sitting   Assessment/Plan    PT Assessment Patent does  not need any further PT services  PT Problem List         PT Treatment Interventions      PT Goals (Current goals can be found in the Care Plan section)  Acute Rehab PT Goals Patient Stated Goal: home today PT Goal Formulation: All assessment and education complete, DC therapy    Frequency     Barriers to discharge        Co-evaluation               AM-PAC PT "6 Clicks" Mobility  Outcome Measure Help needed turning from your back to your side while in a flat bed without using bedrails?: None Help needed moving from lying on your back to sitting on the side of a flat bed without using bedrails?: None Help needed moving to and from a bed to a chair (including a wheelchair)?: None Help needed standing up from a chair using your arms (e.g., wheelchair or bedside chair)?: None Help needed to walk in hospital room?: A Little Help needed climbing 3-5 steps with a railing? : A Little 6 Click Score: 22    End of Session Equipment Utilized During Treatment: Gait belt Activity Tolerance: Patient tolerated treatment well Patient left: in chair;with call bell/phone within reach;with family/visitor present Nurse Communication: Mobility status PT Visit Diagnosis: Difficulty in walking, not elsewhere classified (R26.2)    Time: 1914-7829 PT Time Calculation (min) (ACUTE ONLY): 29 min   Charges:   PT Evaluation $PT Eval Moderate Complexity: 1 Mod PT Treatments $Gait Training: 8-22 mins        Lewis Shock, PT, DPT Acute Rehabilitation Services Pager #: [phone] Office #: [phone]   Wendy Deleon 09/23/2020, 9:00 AM

## 2020-09-29 ENCOUNTER — Other Ambulatory Visit: Payer: Self-pay

## 2020-09-29 ENCOUNTER — Encounter: Payer: Self-pay | Admitting: Family Medicine

## 2020-09-29 ENCOUNTER — Ambulatory Visit (INDEPENDENT_AMBULATORY_CARE_PROVIDER_SITE_OTHER): Payer: No Typology Code available for payment source | Admitting: Family Medicine

## 2020-09-29 VITALS — BP 122/84 | HR 101 | Temp 98.9°F | Ht 64.0 in

## 2020-09-29 DIAGNOSIS — G8929 Other chronic pain: Secondary | ICD-10-CM

## 2020-09-29 DIAGNOSIS — Z9889 Other specified postprocedural states: Secondary | ICD-10-CM

## 2020-09-29 DIAGNOSIS — M79641 Pain in right hand: Secondary | ICD-10-CM

## 2020-09-29 NOTE — Progress Notes (Signed)
Moe Brier DOB: 1970/09/12 Encounter date: 09/29/2020  This is a 50 y.o. female who presents with Chief Complaint  Patient presents with   Discuss disability denial letter    History of present illness: Just had back surgery last week. 2-3 left side, 4-5 bilateral discectomy. States she feels "like crap". Radiating pain is better. Back has been bothering her since 2019. This happened with picking up trash can. Continued to progress. Lower back pain, radiation of pain mostly down right leg, but also started getting into top of left leg. Got to point where right leg would go numb. She fell 8 times; including once during PT. Surgery went well for her. She has follow up with surgeon next Friday. Currently having a lot of pain. She is icing back regularly, which helps. Using hydrocodone, diazepam, advil between.   She has been following with me for pain secondary to multiple amputations right fingers. This happened back in 2014 (06/27/12) when she sawed off fingers with table saw. She underwent four separate elaborate surgeries to follow including 06/2012, 09/24/12, 08/03/13, 04/11/15.  She cannot grip, hard to pick up anything with hand - continuous pain since amputation. She was working in [employer] previously. She was approved for disability 03/31/15 and had hearing for this final decision. She has brought in her previous paperwork and disability approval today. She qualified for full disability. She has not improved at all in terms of hand function and post accident symptoms. She continues to have difficulty with use of the hand. She always has pain in the hand. She is extremely sensitive to temperature changes in either direction with regards to nerve pain in this hand. She has fused 2nd through 5th digits right hand, and her thumb is completely numb. She cannot grip and she does not have fine motor ability with hand.    Allergies  Allergen Reactions   Cyclobenzaprine Anaphylaxis and  Shortness Of Breath    Tongue swelling, throat burning, "feels weird to breathe"   Tizanidine Anaphylaxis and Shortness Of Breath    Tongue swelling, throat burning, "feels weird to breathe"   Eggs Or Egg-Derived Products Other (See Comments)    Vomiting, stomach swelling and diarrhea   Oxycodone     Feels over-medicated   Tape Rash    adhesive   Current Meds  Medication Sig   augmented betamethasone dipropionate (DIPROLENE-AF) 0.05 % ointment Apply 1 application topically 2 (two) times daily as needed (blisters).   cetirizine (ZYRTEC) 10 MG tablet Take 10 mg by mouth daily.   diazepam (VALIUM) 5 MG tablet Take 1 tablet (5 mg total) by mouth every 12 (twelve) hours as needed for muscle spasms.   docusate sodium (COLACE) 100 MG capsule Take 1 capsule (100 mg total) by mouth 2 (two) times daily.   HYDROcodone-acetaminophen (NORCO) 7.5-325 MG tablet Take 1-2 tablets by mouth every 4 (four) hours as needed for severe pain ((score 7 to 10)).   ibuprofen (ADVIL) 200 MG tablet Take 800 mg by mouth every 8 (eight) hours as needed for moderate pain.   Multiple Vitamins-Minerals (MULTIVITAMIN ADULTS PO) Take 1 tablet by mouth daily.   Norethindrone-Ethinyl Estradiol-Fe Biphas (LO LOESTRIN FE) 1 MG-10 MCG / 10 MCG tablet Take 1 tablet by mouth daily.   pravastatin (PRAVACHOL) 40 MG tablet TAKE 1 TABLET BY MOUTH EVERY DAY (Patient taking differently: Take 40 mg by mouth daily.)   Probiotic Product (PROBIOTIC PO) Take by mouth.   simethicone (MYLICON) 125 MG chewable tablet Chew 125  mg by mouth daily as needed for flatulence.   [DISCONTINUED] citalopram (CELEXA) 10 MG tablet TAKE 1 TABLET BY MOUTH EVERY DAY   [DISCONTINUED] methocarbamol (ROBAXIN) 500 MG tablet Take 1 tablet (500 mg total) by mouth every 6 (six) hours as needed for muscle spasms.    Review of Systems  Constitutional:  Negative for chills, fatigue and fever.  Respiratory:  Negative for cough, chest tightness, shortness of breath and  wheezing.   Cardiovascular:  Negative for chest pain, palpitations and leg swelling.  Musculoskeletal:  Positive for back pain.  Neurological:  Positive for weakness (right hand) and numbness.  Psychiatric/Behavioral:  Positive for sleep disturbance.    Objective:  BP 122/84 (BP Location: Right Arm, Patient Position: Sitting, Cuff Size: Normal)   Pulse (!) 101   Temp 98.9 F (37.2 C) (Oral)   Ht 5\' 4"  (1.626 m)   LMP 09/07/2020 (Approximate)   SpO2 96%   BMI 28.32 kg/m       BP Readings from Last 3 Encounters:  09/29/20 122/84  09/23/20 132/64  09/21/20 131/80   Wt Readings from Last 3 Encounters:  09/22/20 165 lb (74.8 kg)  09/21/20 164 lb 6.4 oz (74.6 kg)  09/20/20 165 lb (74.8 kg)    Physical Exam Pulmonary:     Effort: Pulmonary effort is normal.  Musculoskeletal:     Comments: Using walker to get up and to walk - slow walking. Made more difficult with need to put pressure on right hand with walking.   She cannot close right hand. No flexibility in 2nd through 5th digits.   Neurological:     Mental Status: She is alert.  Psychiatric:        Mood and Affect: Mood normal.    Assessment/Plan  1. Status post lumbar laminectomy She has follow up next week with surgeon. Surgical scars are healing well.   2. Chronic hand pain, right She has had no change in status with hand. After review of prior paperwork and disability approval, there would be no reason to change her disability status. On a separate note, her back is currently extremely limiting for her and although this is not what her disability was granted for in the past, is certainly impacting her daily functioning. She is still in recovery post op; so hopeful that back pain will improve and nerve pain will improve through recovery process. I would support continued disability based on review of her prior approval.   Return if symptoms worsen or fail to improve. I did help her complete paperwork today which just  required dates of visit and ongoing diagnoses.    11/20/20, MD

## 2020-10-05 ENCOUNTER — Encounter: Payer: Self-pay | Admitting: Surgery

## 2020-10-05 ENCOUNTER — Ambulatory Visit (INDEPENDENT_AMBULATORY_CARE_PROVIDER_SITE_OTHER): Payer: No Typology Code available for payment source | Admitting: Surgery

## 2020-10-05 ENCOUNTER — Other Ambulatory Visit: Payer: Self-pay

## 2020-10-05 VITALS — BP 115/75 | HR 88

## 2020-10-05 DIAGNOSIS — Z9889 Other specified postprocedural states: Secondary | ICD-10-CM

## 2020-10-05 DIAGNOSIS — M7061 Trochanteric bursitis, right hip: Secondary | ICD-10-CM

## 2020-10-05 MED ORDER — HYDROCODONE-ACETAMINOPHEN 10-325 MG PO TABS: 1.0000 | ORAL_TABLET | ORAL | 0 refills | Status: DC | PRN

## 2020-10-05 NOTE — Progress Notes (Signed)
50 year-old female who is 2-week status post left L2-3, L4-5 and L5-S1 microdiscectomy returns.  States that she is having right lateral hip pain and some buttock pain.  Preop right lower extremity radiculopathy is gone.  She is ambulating with a walker.  Accompanied by husband today.  Exam Pleasant female alert and oriented in no acute distress.  Surgical stations look good.  I did have some Steri-Strips applied to the upper incision.  No drainage or signs of infection.  Neurologically intact.  She does have marked tenderness over the right hip greater trochanter bursa and less tender over the left side.   Assessment Status post lumbar microdiscectomies at L2-3, L4-5 and L5-S1. Right greater than left greater trochanteric bursitis.   Plan Advised patient the importance of no bending, lifting, twisting, pushing.  Gradual increase walking distances but nothing too aggressive.  I will have her follow-up with me in 1 week for recheck and if she still continues to have marked discomfort over the right hip greater trochanter bursa I will plan to do a Marcaine/Depo-Medrol injection there.  If she is doing fine we will cancel that appointment and have her follow-up with Dr. Otelia Sergeant in 4 weeks.  All questions answered.

## 2020-10-10 NOTE — Discharge Summary (Signed)
Patient ID: Wendy Deleon MRN: [id number] DOB/AGE: 1970-05-14 50 y.o.  Admit date: 09/22/2020 Discharge date: 09/23/2020  Admission Diagnoses:  Active Problems:   Status post lumbar laminectomy   Herniation of lumbar intervertebral disc with radiculopathy   Other spondylosis with radiculopathy, lumbar region   Discharge Diagnoses:  Active Problems:   Status post lumbar laminectomy   Herniation of lumbar intervertebral disc with radiculopathy   Other spondylosis with radiculopathy, lumbar region  status post Procedure(s): MICRODISCECTOMY LEFT L2-3, BIILATERAL MICRODISCECTOMY L4-5 AND L5-S1  Past Medical History:  Diagnosis Date   Allergy    Anxiety    Arthritis    Dysrhythmia    "skips beats" per pcp.    Family history of adverse reaction to anesthesia    father "his body would not wake up from anesthesia, started going into organ failure   History of chicken pox     Surgeries: Procedure(s): MICRODISCECTOMY LEFT L2-3, BIILATERAL MICRODISCECTOMY L4-5 AND L5-S1 on 09/22/2020   Consultants:   Discharged Condition: Improved  Hospital Course: Wendy Deleon is an 50 y.o. female who was admitted 09/22/2020 for operative treatment of lumbar HNP. Patient failed conservative treatments (please see the history and physical for the specifics) and had severe unremitting pain that affects sleep, daily activities and work/hobbies. After pre-op clearance, the patient was taken to the operating room on 09/22/2020 and underwent  Procedure(s): MICRODISCECTOMY LEFT L2-3, BIILATERAL MICRODISCECTOMY L4-5 AND L5-S1.    Patient was given perioperative antibiotics:  Anti-infectives (From admission, onward)    Start     Dose/Rate Route Frequency Ordered Stop   09/23/20 0600  ceFAZolin (ANCEF) IVPB 2g/100 mL premix        2 g 200 mL/hr over 30 Minutes Intravenous On call to O.R. 09/22/20 1310 09/23/20 0557   09/22/20 2200  ceFAZolin (ANCEF) IVPB 2g/100 mL premix        2  g 200 mL/hr over 30 Minutes Intravenous Every 8 hours 09/22/20 2108 09/23/20 0553        Patient was given sequential compression devices and early ambulation to prevent DVT.   Patient benefited maximally from hospital stay and there were no complications. At the time of discharge, the patient was urinating/moving their bowels without difficulty, tolerating a regular diet, pain is controlled with oral pain medications and they have been cleared by PT/OT.   Recent vital signs: No data found.   Recent laboratory studies: No results for input(s): WBC, HGB, HCT, PLT, NA, K, CL, CO2, BUN, CREATININE, GLUCOSE, INR, CALCIUM in the last 72 hours.  Invalid input(s): PT, 2   Discharge Medications:   Allergies as of 09/23/2020       Reactions   Cyclobenzaprine Anaphylaxis, Shortness Of Breath   Tongue swelling, throat burning, "feels weird to breathe"   Tizanidine Anaphylaxis, Shortness Of Breath   Tongue swelling, throat burning, "feels weird to breathe"   Eggs Or Egg-derived Products Other (See Comments)   Vomiting, stomach swelling and diarrhea   Oxycodone    Feels over-medicated   Tape Rash   adhesive        Medication List     STOP taking these medications    HYDROcodone-acetaminophen 5-325 MG tablet Commonly known as: NORCO/VICODIN   HYDROcodone-acetaminophen 7.5-325 MG tablet Commonly known as: NORCO   traMADol 50 MG tablet Commonly known as: ULTRAM       TAKE these medications    augmented betamethasone dipropionate 0.05 % ointment Commonly known as: DIPROLENE-AF Apply 1 application topically  2 (two) times daily as needed (blisters).   cetirizine 10 MG tablet Commonly known as: ZYRTEC Take 10 mg by mouth daily.   diazepam 5 MG tablet Commonly known as: VALIUM Take 1 tablet (5 mg total) by mouth every 12 (twelve) hours as needed for muscle spasms.   docusate sodium 100 MG capsule Commonly known as: COLACE Take 1 capsule (100 mg total) by mouth 2 (two)  times daily.   ibuprofen 200 MG tablet Commonly known as: ADVIL Take 800 mg by mouth every 8 (eight) hours as needed for moderate pain.   Lo Loestrin Fe 1 MG-10 MCG / 10 MCG tablet Generic drug: Norethindrone-Ethinyl Estradiol-Fe Biphas Take 1 tablet by mouth daily.   MULTIVITAMIN ADULTS PO Take 1 tablet by mouth daily.   pravastatin 40 MG tablet Commonly known as: PRAVACHOL TAKE 1 TABLET BY MOUTH EVERY DAY   simethicone 125 MG chewable tablet Commonly known as: MYLICON Chew 125 mg by mouth daily as needed for flatulence.        Diagnostic Studies: DG Lumbar Spine 2-3 Views  Result Date: 09/22/2020 CLINICAL DATA:  Intraoperative localization for micro discectomy EXAM: LUMBAR SPINE - 2 VIEW COMPARISON:  05/19/2020 FINDINGS: Two lateral views of the lumbar spine were obtained intraoperatively. First image demonstrates needles in the posterior soft tissues at the L3-4 and L5 levels. Subsequent image shows surgical instruments just below the L2-3 disc space and at the L5 level. IMPRESSION: Intraoperative localization as described. Electronically Signed   By: Alcide Clever M.D.   On: 09/22/2020 20:24    Discharge Instructions     Call MD / Call 911   Complete by: As directed    If you experience chest pain or shortness of breath, CALL 911 and be transported to the hospital emergency room.  If you develope a fever above 101 F, pus (white drainage) or increased drainage or redness at the wound, or calf pain, call your surgeon's office.   Constipation Prevention   Complete by: As directed    Drink plenty of fluids.  Prune juice may be helpful.  You may use a stool softener, such as Colace (over the counter) 100 mg twice a day.  Use MiraLax (over the counter) for constipation as needed.   Diet general   Complete by: As directed    Discharge instructions   Complete by: As directed    No lifting greater than 10 lbs. Avoid bending, stooping and twisting. Walk in house for first week  them may start to get out slowly increasing distance up to one mile by 6 weeks post op. Keep incision dry for 3 days, may use tegaderm or similar water impervious dressing.   Driving restrictions   Complete by: As directed    No driving for 2 weeks   Increase activity slowly as tolerated   Complete by: As directed    Lifting restrictions   Complete by: As directed    No lifting for 8 weeks   Post-operative opioid taper instructions:   Complete by: As directed    POST-OPERATIVE OPIOID TAPER INSTRUCTIONS: It is important to wean off of your opioid medication as soon as possible. If you do not need pain medication after your surgery it is ok to stop day one. Opioids include: Codeine, Hydrocodone(Norco, Vicodin), Oxycodone(Percocet, oxycontin) and hydromorphone amongst others.  Long term and even short term use of opiods can cause: Increased pain response Dependence Constipation Depression Respiratory depression And more.  Withdrawal symptoms can include Flu like  symptoms Nausea, vomiting And more Techniques to manage these symptoms Hydrate well Eat regular healthy meals Stay active Use relaxation techniques(deep breathing, meditating, yoga) Do Not substitute Alcohol to help with tapering If you have been on opioids for less than two weeks and do not have pain than it is ok to stop all together.  Plan to wean off of opioids This plan should start within one week post op of your joint replacement. Maintain the same interval or time between taking each dose and first decrease the dose.  Cut the total daily intake of opioids by one tablet each day Next start to increase the time between doses. The last dose that should be eliminated is the evening dose.           Follow-up Information     Kerrin Champagne, MD Follow up.   Specialty: Orthopedic Surgery Why: For wound re-check Contact information: [address] [place] [address] [phone]                  Discharge Plan:  discharge to home  Disposition:     Signed: Zonia Kief  10/10/2020, 1:59 PM

## 2020-10-11 ENCOUNTER — Encounter: Payer: No Typology Code available for payment source | Admitting: Surgery

## 2020-10-18 ENCOUNTER — Other Ambulatory Visit: Payer: Self-pay

## 2020-10-18 ENCOUNTER — Ambulatory Visit: Payer: Self-pay

## 2020-10-18 ENCOUNTER — Ambulatory Visit (INDEPENDENT_AMBULATORY_CARE_PROVIDER_SITE_OTHER): Payer: No Typology Code available for payment source | Admitting: Specialist

## 2020-10-18 ENCOUNTER — Encounter: Payer: Self-pay | Admitting: Specialist

## 2020-10-18 VITALS — BP 129/80 | HR 114 | Ht 64.0 in | Wt 165.0 lb

## 2020-10-18 DIAGNOSIS — Z9889 Other specified postprocedural states: Secondary | ICD-10-CM

## 2020-10-18 DIAGNOSIS — M7061 Trochanteric bursitis, right hip: Secondary | ICD-10-CM

## 2020-10-18 NOTE — Progress Notes (Signed)
Office Visit Note   Patient: Wendy Deleon           Date of Birth: [birth year]           MRN: [id number] Visit Date: 10/18/2020              Requested by: Wynn Banker, MD [address] [place],  [place] [address] PCP: Wynn Banker, MD   Assessment & Plan: Visit Diagnoses:  1. S/P lumbar microdiscectomy   2. Greater trochanteric bursitis, right   Incisions healing no erythrema or swelling  Motor is normal Radiographs without acute changes.   Plan: Avoid frequent bending and stooping  No lifting greater than 10 lbs. May use ice or moist heat for pain. Weight loss is of benefit. Best medication for lumbar disc disease is arthritis medications like motrin, celebrex and naprosyn. Exercise is important to improve your indurance and does allow people to function better inspite of back pain.    Follow-Up Instructions: No follow-ups on file.   Orders:  Orders Placed This Encounter  Procedures   XR Lumbar Spine 2-3 Views   No orders of the defined types were placed in this encounter.     Procedures: No procedures performed   Clinical Data: No additional findings.   Subjective: Chief Complaint  Patient presents with   Lower Back - Routine Post Op    She had Left L2-3 and bilateral L4-5 L5S1 Microdiscectomies    HPI  Review of Systems   Objective: Vital Signs: BP 129/80 (BP Location: Left Arm, Patient Position: Sitting)   Pulse (!) 114   Ht 5\' 4"  (1.626 m)   Wt 165 lb (74.8 kg)   BMI 28.32 kg/m   Physical Exam  Ortho Exam  Specialty Comments:  No specialty comments available.  Imaging: No results found.   PMFS History: Patient Active Problem List   Diagnosis Date Noted   Status post lumbar laminectomy 09/22/2020   Herniation of lumbar intervertebral disc with radiculopathy    Other spondylosis with radiculopathy, lumbar region    Chronic hand pain, right 02/20/2019   Chronic low back pain 02/20/2019    Hyperlipidemia 02/20/2019   Past Medical History:  Diagnosis Date   Allergy    Anxiety    Arthritis    Dysrhythmia    "skips beats" per pcp.    Family history of adverse reaction to anesthesia    father "his body would not wake up from anesthesia, started going into organ failure   History of chicken pox     Family History  Problem Relation Age of Onset   Arthritis Mother    Diabetes Mother    Hypertension Mother    Kidney disease Mother    Miscarriages / 04/20/2019 Mother    Arthritis Father    Hearing loss Father    High Cholesterol Father    Hypertension Father    Diabetes Sister    Alcohol abuse Sister    Asthma Daughter    Migraines Daughter    Learning disabilities Daughter    Arthritis Maternal Grandmother    Hearing loss Maternal Grandmother    Heart attack Maternal Grandmother    High blood pressure Maternal Grandmother    Kidney disease Maternal Grandmother    Stroke Maternal Grandmother 51   Arthritis Maternal Grandfather    Hearing loss Maternal Grandfather    Heart attack Maternal Grandfather    Heart disease Maternal Grandfather    High blood pressure Maternal Grandfather  Arthritis/Rheumatoid Paternal Grandmother    Hearing loss Paternal Grandmother    Heart attack Paternal Grandmother    Heart disease Paternal Grandmother    Kidney disease Paternal Grandmother    Other Paternal Grandfather        no medical history known    Past Surgical History:  Procedure Laterality Date   HAND SURGERY Right    2014 x 3,2016   lumbar     DDD-treated by Dr Minus Liberty LAMINECTOMY/DECOMPRESSION MICRODISCECTOMY N/A 09/22/2020   Procedure: MICRODISCECTOMY LEFT L2-3, BIILATERAL MICRODISCECTOMY L4-5 AND L5-S1;  Surgeon: Kerrin Champagne, MD;  Location: [facility] OR;  Service: Orthopedics;  Laterality: N/A;   Social History   Occupational History   Not on file  Tobacco Use   Smoking status: Every Day    Packs/day: 1.00    Types: Cigarettes   Smokeless tobacco:  Never  Vaping Use   Vaping Use: Former  Substance and Sexual Activity   Alcohol use: No   Drug use: Never   Sexual activity: Yes    Partners: Male    Comment: husband with vasectomy

## 2020-10-18 NOTE — Patient Instructions (Signed)
Plan: Avoid frequent bending and stooping  No lifting greater than 10 lbs. May use ice or moist heat for pain. Weight loss is of benefit. Best medication for lumbar disc disease is arthritis medications like motrin, celebrex and naprosyn. Exercise is important to improve your indurance and does allow people to function better inspite of back pain. Physical therapy ROM exercises, H, M U/S and LE strengthening , slow progression to returning to ROM and Core strengthening.

## 2020-10-23 ENCOUNTER — Ambulatory Visit: Payer: No Typology Code available for payment source | Admitting: Rehabilitative and Restorative Service Providers"

## 2020-11-02 ENCOUNTER — Ambulatory Visit: Payer: No Typology Code available for payment source | Admitting: Rehabilitative and Restorative Service Providers"

## 2020-11-08 ENCOUNTER — Encounter: Payer: Self-pay | Admitting: Physical Therapy

## 2020-11-08 ENCOUNTER — Ambulatory Visit (INDEPENDENT_AMBULATORY_CARE_PROVIDER_SITE_OTHER): Payer: No Typology Code available for payment source | Admitting: Physical Therapy

## 2020-11-08 ENCOUNTER — Other Ambulatory Visit: Payer: Self-pay

## 2020-11-08 DIAGNOSIS — G8929 Other chronic pain: Secondary | ICD-10-CM

## 2020-11-08 DIAGNOSIS — R262 Difficulty in walking, not elsewhere classified: Secondary | ICD-10-CM

## 2020-11-08 DIAGNOSIS — M5441 Lumbago with sciatica, right side: Secondary | ICD-10-CM | POA: Diagnosis not present

## 2020-11-08 DIAGNOSIS — M6281 Muscle weakness (generalized): Secondary | ICD-10-CM

## 2020-11-08 NOTE — Patient Instructions (Signed)
Access Code: [email or web address] URL: [email or web address] Date: 11/08/2020 Prepared by: Narda Amber  Exercises Seated Hamstring Stretch - 2 x daily - 3 reps - 30 seconds hold Supine Posterior Pelvic Tilt - 2 x daily - 2 sets - 10 reps - 5 seconds hold Supine 90/90 Alternating Heel Touches with Posterior Pelvic Tilt - 2 x daily - 2 sets - 10 reps Supine Figure 4 Piriformis Stretch - 2 x daily - 3 reps - 30 seconds hold

## 2020-11-08 NOTE — Therapy (Signed)
[facility] Physical Therapy [address] [place], [place], [address] Phone: [phone]   Fax:  [phone]  Physical Therapy Evaluation  Patient Details  Name: Wendy Deleon MRN: [id number] Date of Birth: [birth year] Referring Provider (PT): Vira Browns, MD   Encounter Date: 11/08/2020   PT End of Session - 11/08/20 0947     Visit Number 1    Number of Visits 13    Date for PT Re-Evaluation 12/29/20    Progress Note Due on Visit 10    PT Start Time 0850    PT Stop Time 0928    PT Time Calculation (min) 38 min    Activity Tolerance Patient tolerated treatment well    Behavior During Therapy [facility] for tasks assessed/performed             Past Medical History:  Diagnosis Date   Allergy    Anxiety    Arthritis    Dysrhythmia    "skips beats" per pcp.    Family history of adverse reaction to anesthesia    father "his body would not wake up from anesthesia, started going into organ failure   History of chicken pox     Past Surgical History:  Procedure Laterality Date   HAND SURGERY Right    2014 x 3,2016   lumbar     DDD-treated by Dr Minus Liberty LAMINECTOMY/DECOMPRESSION MICRODISCECTOMY N/A 09/22/2020   Procedure: MICRODISCECTOMY LEFT L2-3, BIILATERAL MICRODISCECTOMY L4-5 AND L5-S1;  Surgeon: Kerrin Champagne, MD;  Location: [facility] OR;  Service: Orthopedics;  Laterality: N/A;    There were no vitals filed for this visit.    Subjective Assessment - 11/08/20 0942     Subjective Pt arriving today for evaluation reporting 4/10 with pain meds prior to her visit. Pt s/p lumbar microdicectomy L2-3, bilateral L5-S1 laminectomies/decompression, discectomy L5-S1 on 09/22/2020.    Pertinent History s/p 09/22/2020 microdiscectomy L2-3 Lt , bilateral L5-S1 laminectomies/decompression, discectomy L5-S1  PMH: allergies, anxiety, dysrhythmia, hand surgery    Diagnostic tests X-ray, MRI    Patient Stated Goals Walk and move without hurting     Currently in Pain? 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  --   pt  stating, "I just want to do my own grocery shopping"   Is the patient reluctant to leave their home because of a fear of falling?  No      Prior Function   Level of Independence Independent    Vocation On disability      Cognition   Overall Cognitive Status Within Functional Limits for tasks assessed      Observation/Other Assessments   Focus on Therapeutic Outcomes (FOTO)  30% (predicted 51%)      Posture/Postural Control   Posture/Postural Control Postural limitations      ROM / Strength   AROM / PROM / Strength AROM;Strength      AROM   AROM Assessment Site Lumbar    Lumbar Flexion 45    Lumbar Extension 10    Lumbar - Right Side Bend 8    Lumbar - Left Side Bend 30    Lumbar - Right Rotation limited by precaution    Lumbar - Left Rotation limited by precautions      Strength   Overall Strength Deficits    Strength Assessment  Site Hip;Knee    Right/Left Hip Right;Left    Right Hip Flexion 4/5    Right Hip Extension 4/5    Right Hip ABduction 4/5    Right Hip ADduction 4/5    Left Hip Flexion 5/5    Left Hip Extension 5/5    Left Hip ABduction 5/5    Left Hip ADduction 5/5      Palpation   Palpation comment TTP: lumbar paraspinals, right sacroiliac border into lateral hip      Transfers   Five time sit to stand comments  36 seconds with UE support      Ambulation/Gait   Assistive device Straight cane    Gait Pattern Antalgic;Decreased weight shift to right;Decreased hip/knee flexion - right;Decreased stance time - right;Step-through pattern    Gait Comments Pt amb with straight cane with antalgic gait with mild trendelenburg noted                        Objective measurements completed on examination: See above findings.       [facility] Adult PT Treatment/Exercise - 11/08/20 0001       Exercises   Exercises Lumbar      Lumbar Exercises: Stretches   Active Hamstring Stretch Right;3 reps;20 seconds    Figure 4 Stretch 3 reps;20 seconds      Lumbar Exercises: Supine   Other Supine Lumbar Exercises PPT holding 5 seconds x 5    Other Supine Lumbar Exercises Supine marching with PPT and heels on table x 10                     PT Education - 11/08/20 0946     Education Details PT POC, HEP    Person(s) Educated Patient    Methods Explanation;Demonstration;Handout;Verbal cues;Tactile cues    Comprehension Returned demonstration;Verbalized understanding              PT Short Term Goals - 11/08/20 1048       PT SHORT TERM GOAL #1   Title independent with initial HEP    Time 3    Period Weeks    Status New    Target Date 12/01/20      PT SHORT TERM GOAL #2   Title Pt will be able to perform sit to stand with single UE support.  PT Long Term Goals - 11/08/20 0949       PT LONG TERM GOAL #1   Title independent with final HEP    Time 6     Period Weeks    Status New      PT LONG TERM GOAL #2   Title FOTO score improved to >/=  50% for improved function    Time 6    Period Weeks    Status New      PT LONG TERM GOAL #3   Title Pt will be able to demonstrate 5/5 RLE strength for improved function    Time 6    Period Weeks    Status New    Target Date 12/29/20      PT LONG TERM GOAL #4   Title Pt will report pain of </= 2/10 with ADL's.    Time 6    Period Weeks    Status New    Target Date 12/29/20      PT LONG TERM GOAL #5   Title Pt will be able to amb community distances with no device with pain </= 2/10.    Time 6    Period Weeks    Status New    Target Date 12/29/20      Additional Long Term Goals   Additional Long Term Goals Yes      PT LONG TERM GOAL #6   Title Pt will be able to lift 10 pounds from floor to counter height with no pain using correct body mechanics.    Time 6    Period Weeks    Status New    Target Date 12/29/20                    Plan - 11/08/20 1032     Clinical Impression Statement Pt s/p microdiscectomy L2-3 Lt , bilateral L5-S1 laminectomies/decompression, discectomy L5-S1 on 09/22/2020. Pt is currently 6 weeks post op and still reporting pain in her low back which radiates down rigth hip and into anterior thigh. Pt also with new dx of greater trochangeric bursitis on the right. Pt was educated about continued spinal precautions and to limit driving as long as she was still taking prescription pain meds. Pt instructed in HEP and was able to return demonstration. Pt presenting today with limited right LE strength and overall decreased in lumbar mobillity. Pt amb with straight cane with antalgic gait with decreased weight bearing and shifting to the right side with decreased knee/hip flexion. Pt also with mild trendelenburg on right. Pt with pin point tenderness with palpation of right sided lumbar paraspinals, gluteals, piriformis, and iliopsoas. Pt was issued a DN handout  and treatment intervention was discussed for furhter treatment. Skilled PT needed to address pt's impairments with the below interventions.    Personal Factors and Comorbidities Comorbidity 3+    Comorbidities allergies, anxiety, dysrhythmia, hand surgery    Examination-Activity Limitations Lift;Stand;Stairs;Squat;Sleep;Other;Bend    Examination-Participation Restrictions Other;Community Activity;Driving;Yard Work;Shop    Stability/Clinical Decision Making Stable/Uncomplicated    Clinical Decision Making Low    Rehab Potential Good    PT Frequency 2x / week    PT Duration 6 weeks    PT Treatment/Interventions ADLs/Self Care Home Management;Cryotherapy;Electrical Stimulation;Iontophoresis 4mg /ml Dexamethasone;Moist Heat;Balance training;Therapeutic exercise;Therapeutic activities;Functional mobility training;Stair training;Gait training;Neuromuscular re-education;Manual techniques;Dry needling;Taping;Passive range of motion;Patient/family education    PT Next Visit Plan Nustep, lumbar stretching, piriformis stretching, evaluate for right hip DN, hip flexor stretch, general LE strengtheing as  tolerated    PT Home Exercise Plan Access Code: [email or web address]  URL: [email or web address]  Date: 11/08/2020  Prepared by: Narda Amber    Exercises  Seated Hamstring Stretch - 2 x daily - 3 reps - 30 seconds hold  Supine Posterior Pelvic Tilt - 2 x daily - 2 sets - 10 reps - 5 seconds hold  Supine 90/90 Alternating Heel Touches with Posterior Pelvic Tilt - 2 x daily - 2 sets - 10 reps  Supine Figure 4 Piriformis Stretch - 2 x daily - 3 reps - 30 seconds hold    Consulted and Agree with Plan of Care Patient             Patient will benefit from skilled therapeutic intervention in order to improve the following deficits and impairments:  Pain, Difficulty walking, Decreased range of motion, Decreased activity tolerance, Decreased balance, Decreased mobility, Decreased strength, Postural dysfunction,  Impaired flexibility  Visit Diagnosis: Chronic right-sided low back pain with right-sided sciatica  Muscle weakness (generalized)  Difficulty in walking, not elsewhere classified     Problem List Patient Active Problem List   Diagnosis Date Noted   Status post lumbar laminectomy 09/22/2020   Herniation of lumbar intervertebral disc with radiculopathy    Other spondylosis with radiculopathy, lumbar region    Chronic hand pain, right 02/20/2019   Chronic low back pain 02/20/2019   Hyperlipidemia 02/20/2019    Sharmon Leyden, PT, MPT 11/08/2020, 10:50 AM  [facility] Physical Therapy [address] [place], [place], [address] Phone: [phone]   Fax:  [phone]  Name: Wendy Deleon MRN: [id number] Date of Birth: January 14, 1971

## 2020-11-13 ENCOUNTER — Other Ambulatory Visit: Payer: Self-pay

## 2020-11-13 ENCOUNTER — Ambulatory Visit (INDEPENDENT_AMBULATORY_CARE_PROVIDER_SITE_OTHER): Payer: No Typology Code available for payment source | Admitting: Physical Therapy

## 2020-11-13 ENCOUNTER — Encounter: Payer: Self-pay | Admitting: Physical Therapy

## 2020-11-13 DIAGNOSIS — G8929 Other chronic pain: Secondary | ICD-10-CM

## 2020-11-13 DIAGNOSIS — M6281 Muscle weakness (generalized): Secondary | ICD-10-CM | POA: Diagnosis not present

## 2020-11-13 DIAGNOSIS — M5441 Lumbago with sciatica, right side: Secondary | ICD-10-CM

## 2020-11-13 DIAGNOSIS — R262 Difficulty in walking, not elsewhere classified: Secondary | ICD-10-CM

## 2020-11-13 NOTE — Therapy (Signed)
[facility] Physical Therapy [address] [place], [place], [address] Phone: [phone]   Fax:  [phone]  Physical Therapy Treatment  Patient Details  Name: Wendy Deleon MRN: [id number] Date of Birth: 08-31-1970 Referring Provider (PT): Vira Browns, MD   Encounter Date: 11/13/2020   PT End of Session - 11/13/20 1236     Visit Number 2    Number of Visits 13    Date for PT Re-Evaluation 12/29/20    Progress Note Due on Visit 10    PT Start Time 1145    PT Stop Time 1230    PT Time Calculation (min) 45 min    Activity Tolerance Patient tolerated treatment well    Behavior During Therapy [facility] for tasks assessed/performed             Past Medical History:  Diagnosis Date   Allergy    Anxiety    Arthritis    Dysrhythmia    "skips beats" per pcp.    Family history of adverse reaction to anesthesia    father "his body would not wake up from anesthesia, started going into organ failure   History of chicken pox     Past Surgical History:  Procedure Laterality Date   HAND SURGERY Right    2014 x 3,2016   lumbar     DDD-treated by Dr Minus Liberty LAMINECTOMY/DECOMPRESSION MICRODISCECTOMY N/A 09/22/2020   Procedure: MICRODISCECTOMY LEFT L2-3, BIILATERAL MICRODISCECTOMY L4-5 AND L5-S1;  Surgeon: Kerrin Champagne, MD;  Location: [facility] OR;  Service: Orthopedics;  Laterality: N/A;    There were no vitals filed for this visit.   Subjective Assessment - 11/13/20 1146     Subjective She reports doing her exercises except yesterday. She was hurting too bad maybe related to weather. She is having difficulty sleeping.    Pertinent History s/p 09/22/2020 microdiscectomy L2-3 Lt , bilateral L5-S1 laminectomies/decompression, discectomy L5-S1  PMH: allergies, anxiety, dysrhythmia, hand surgery    Diagnostic tests X-ray, MRI    Patient Stated Goals Walk and move without hurting    Currently in Pain? Yes    Pain Score 6    since last PT appt, lowest  0/10 & highest 10/10   Pain Location Back    Pain Orientation Lower;Mid;Right    Pain Descriptors / Indicators Sore;Spasm;Sharp;Tingling;Pins and needles    Pain Type Chronic pain    Pain Onset More than a month ago    Pain Frequency Intermittent    Aggravating Factors  sit or lay / staying in one position too long,    Pain Relieving Factors meds tries Advil & ice pack but has to take prescription,                               [facility] Adult PT Treatment/Exercise - 11/13/20 1145       Self-Care   Self-Care ADL's    ADL's PT demo & verbal cues on getting in & out of bed via sidelying with proper technique to protect back. Pt return demo & verbalized understanding.      Lumbar Exercises: Stretches   Active Hamstring Stretch Right;Left;2 reps;30 seconds    Active Hamstring Stretch Limitations seated with strap DF    Pelvic Tilt 10 reps;5 seconds   supine   Figure 4 Stretch 2 reps;30 seconds;Supine;With overpressure      Lumbar Exercises: Standing   Heel Raises 10 reps;5 seconds   BUE  support   Other Standing Lumbar Exercises Posterior pelvic tilts (PPT),  PPT with alternating hip abduction 10 reps with BUE support on chair back    Other Standing Lumbar Exercises BUE support (counter & chair back) upright posture - PPT stepping forward to backward to bilateral stance - alternate LE for 3 reps ea LE.  PT demo, tactile & verbal cues      Lumbar Exercises: Seated   Sit to Stand 10 reps   light UE support on chair back   Sit to Stand Limitations cues on technique,      Lumbar Exercises: Supine   Pelvic Tilt 10 reps;5 seconds    Pelvic Tilt Limitations tactile & verbal cues    Bridge 10 reps;5 seconds    Other Supine Lumbar Exercises Supine marching with PPT and heels on table x 10                       PT Short Term Goals - 11/08/20 1048       PT SHORT TERM GOAL #1   Title independent with initial HEP    Time 3    Period Weeks    Status New     Target Date 12/01/20      PT SHORT TERM GOAL #2   Title Pt will be able to perform sit to stand with single UE support.    Time 3    Period Weeks    Status New    Target Date 12/01/20               PT Long Term Goals - 11/08/20 0949       PT LONG TERM GOAL #1   Title independent with final HEP    Time 6    Period Weeks    Status New      PT LONG TERM GOAL #2   Title FOTO score improved to >/=  50% for improved function    Time 6    Period Weeks    Status New      PT LONG TERM GOAL #3   Title Pt will be able to demonstrate 5/5 RLE strength for improved function    Time 6    Period Weeks    Status New    Target Date 12/29/20      PT LONG TERM GOAL #4   Title Pt will report pain of </= 2/10 with ADL's.    Time 6    Period Weeks    Status New    Target Date 12/29/20      PT LONG TERM GOAL #5   Title Pt will be able to amb community distances with no device with pain </= 2/10.    Time 6    Period Weeks    Status New    Target Date 12/29/20      Additional Long Term Goals   Additional Long Term Goals Yes      PT LONG TERM GOAL #6   Title Pt will be able to lift 10 pounds from floor to counter height with no pain using correct body mechanics.    Time 6    Period Weeks    Status New    Target Date 12/29/20                   Plan - 11/13/20 1236     Clinical Impression Statement PT reviewed & updated HEP. Pt appears to understand the  HEP but needs cues for core stabilization.  She improved sit to/from stand with instruction.  Her RLE is weaker with one noted buckling during standing exercises.    Personal Factors and Comorbidities Comorbidity 3+    Comorbidities allergies, anxiety, dysrhythmia, hand surgery    Examination-Activity Limitations Lift;Stand;Stairs;Squat;Sleep;Other;Bend    Examination-Participation Restrictions Other;Community Activity;Driving;Yard Work;Shop    Stability/Clinical Decision Making Stable/Uncomplicated    Rehab  Potential Good    PT Frequency 2x / week    PT Duration 6 weeks    PT Treatment/Interventions ADLs/Self Care Home Management;Cryotherapy;Electrical Stimulation;Iontophoresis 4mg /ml Dexamethasone;Moist Heat;Balance training;Therapeutic exercise;Therapeutic activities;Functional mobility training;Stair training;Gait training;Neuromuscular re-education;Manual techniques;Dry needling;Taping;Passive range of motion;Patient/family education    PT Next Visit Plan Therapeutic exercise to work on core stabilization, lumbar stretching including piriformis, hip flexors & quadratus lumborum,    PT Home Exercise Plan Access Code: [email or web address]    Consulted and Agree with Plan of Care Patient             Patient will benefit from skilled therapeutic intervention in order to improve the following deficits and impairments:  Pain, Difficulty walking, Decreased range of motion, Decreased activity tolerance, Decreased balance, Decreased mobility, Decreased strength, Postural dysfunction, Impaired flexibility  Visit Diagnosis: Chronic right-sided low back pain with right-sided sciatica  Muscle weakness (generalized)  Difficulty in walking, not elsewhere classified     Problem List Patient Active Problem List   Diagnosis Date Noted   Status post lumbar laminectomy 09/22/2020   Herniation of lumbar intervertebral disc with radiculopathy    Other spondylosis with radiculopathy, lumbar region    Chronic hand pain, right 02/20/2019   Chronic low back pain 02/20/2019   Hyperlipidemia 02/20/2019    04/20/2019, PT, DPT 11/13/2020, 12:41 PM  [facility] Physical Therapy [address] [place], [place], [place] Phone: [phone]   Fax:  [phone]  Name: Wendy Deleon MRN: Novella Olive Date of Birth: 1970-07-28

## 2020-11-15 ENCOUNTER — Ambulatory Visit (INDEPENDENT_AMBULATORY_CARE_PROVIDER_SITE_OTHER): Payer: No Typology Code available for payment source | Admitting: Rehabilitative and Restorative Service Providers"

## 2020-11-15 ENCOUNTER — Encounter: Payer: Self-pay | Admitting: Rehabilitative and Restorative Service Providers"

## 2020-11-15 ENCOUNTER — Other Ambulatory Visit: Payer: Self-pay

## 2020-11-15 ENCOUNTER — Encounter: Payer: No Typology Code available for payment source | Admitting: Specialist

## 2020-11-15 ENCOUNTER — Encounter: Payer: No Typology Code available for payment source | Admitting: Physical Therapy

## 2020-11-15 DIAGNOSIS — R262 Difficulty in walking, not elsewhere classified: Secondary | ICD-10-CM

## 2020-11-15 DIAGNOSIS — M6281 Muscle weakness (generalized): Secondary | ICD-10-CM

## 2020-11-15 DIAGNOSIS — G8929 Other chronic pain: Secondary | ICD-10-CM

## 2020-11-15 DIAGNOSIS — M5441 Lumbago with sciatica, right side: Secondary | ICD-10-CM | POA: Diagnosis not present

## 2020-11-15 NOTE — Therapy (Signed)
[facility] [address] [place], [place], [address] Phone: [phone]   Fax:  [phone]  Physical Therapy Treatment  Patient Details  Name: Wendy Deleon MRN: [id number] Date of Birth: 09-10-1970 Referring Provider (PT): Vira Browns, MD   Encounter Date: 11/15/2020   PT End of Session - 11/15/20 0818     Visit Number 3    Number of Visits 13    Date for PT Re-Evaluation 12/29/20    Progress Note Due on Visit 10    PT Start Time 0808    PT Stop Time 0847    PT Time Calculation (min) 39 min    Activity Tolerance Patient tolerated treatment well    Behavior During Therapy [facility] for tasks assessed/performed             Past Medical History:  Diagnosis Date   Allergy    Anxiety    Arthritis    Dysrhythmia    "skips beats" per pcp.    Family history of adverse reaction to anesthesia    father "his body would not wake up from anesthesia, started going into organ failure   History of chicken pox     Past Surgical History:  Procedure Laterality Date   HAND SURGERY Right    2014 x 3,2016   lumbar     DDD-treated by Dr Minus Liberty LAMINECTOMY/DECOMPRESSION MICRODISCECTOMY N/A 09/22/2020   Procedure: MICRODISCECTOMY LEFT L2-3, BIILATERAL MICRODISCECTOMY L4-5 AND L5-S1;  Surgeon: Kerrin Champagne, MD;  Location: [facility] OR;  Service: Orthopedics;  Laterality: N/A;    There were no vitals filed for this visit.   Subjective Assessment - 11/15/20 0816     Subjective Pt. stated 2/10 complaints upon arrival today.  Pt. stated morning is very tough to get moving.    Pertinent History s/p 09/22/2020 microdiscectomy L2-3 Lt , bilateral L5-S1 laminectomies/decompression, discectomy L5-S1  PMH: allergies, anxiety, dysrhythmia, hand surgery    Diagnostic tests X-ray, MRI    Patient Stated Goals Walk and move without hurting    Currently in Pain? Yes    Pain Score 2     Pain Location Back    Pain Orientation Lower;Mid;Right     Pain Descriptors / Indicators Sore;Sharp;Tightness    Pain Type Chronic pain    Pain Onset More than a month ago    Pain Frequency Intermittent    Aggravating Factors  morning times are rough, some pain c pelvic tilts    Pain Relieving Factors medicine, rest                               [facility] Adult PT Treatment/Exercise - 11/15/20 0001       Lumbar Exercises: Stretches   Lower Trunk Rotation 3 reps   3 x 15 sec bilateral     Lumbar Exercises: Aerobic   Nustep Lvl 5 UE/LE      Lumbar Exercises: Seated   Sit to Stand 10 reps   22 inch table     Lumbar Exercises: Supine   Pelvic Tilt 5 reps;10 reps    Bridge 10 reps;5 seconds    Other Supine Lumbar Exercises supine blue band clam shell 20x bilateral c TrA contraction hold    Other Supine Lumbar Exercises supine marching c TrA contraction 2x 10                       PT  Short Term Goals - 11/15/20 0818       PT SHORT TERM GOAL #1   Title independent with initial HEP    Time 3    Period Weeks    Status On-going    Target Date 12/01/20      PT SHORT TERM GOAL #2   Title Pt will be able to perform sit to stand with single UE support.    Time 3    Period Weeks    Status On-going    Target Date 12/01/20               PT Long Term Goals - 11/08/20 0949       PT LONG TERM GOAL #1   Title independent with final HEP    Time 6    Period Weeks    Status New      PT LONG TERM GOAL #2   Title FOTO score improved to >/=  50% for improved function    Time 6    Period Weeks    Status New      PT LONG TERM GOAL #3   Title Pt will be able to demonstrate 5/5 RLE strength for improved function    Time 6    Period Weeks    Status New    Target Date 12/29/20      PT LONG TERM GOAL #4   Title Pt will report pain of </= 2/10 with ADL's.    Time 6    Period Weeks    Status New    Target Date 12/29/20      PT LONG TERM GOAL #5   Title Pt will be able to amb community distances with  no device with pain </= 2/10.    Time 6    Period Weeks    Status New    Target Date 12/29/20      Additional Long Term Goals   Additional Long Term Goals Yes      PT LONG TERM GOAL #6   Title Pt will be able to lift 10 pounds from floor to counter height with no pain using correct body mechanics.    Time 6    Period Weeks    Status New    Target Date 12/29/20                   Plan - 11/15/20 0827     Clinical Impression Statement Pt. continued to show mobility, strength and movement coordination deficits in lumbar and hips at this time (Rt > Lt) that impair daily activity and ambulation from PLOF that would benefit from skilled PT services.  Pt. still in early stages of post surgical recovery and process with some adaptations needed to avoid exacerbation of symptoms at times ( limited overall movement or number of activity).    Personal Factors and Comorbidities Comorbidity 3+    Comorbidities allergies, anxiety, dysrhythmia, hand surgery    Examination-Activity Limitations Lift;Stand;Stairs;Squat;Sleep;Other;Bend    Examination-Participation Restrictions Other;Community Activity;Driving;Yard Work;Shop    Stability/Clinical Decision Making Stable/Uncomplicated    Rehab Potential Good    PT Frequency 2x / week    PT Duration 6 weeks    PT Treatment/Interventions ADLs/Self Care Home Management;Cryotherapy;Electrical Stimulation;Iontophoresis 4mg /ml Dexamethasone;Moist Heat;Balance training;Therapeutic exercise;Therapeutic activities;Functional mobility training;Stair training;Gait training;Neuromuscular re-education;Manual techniques;Dry needling;Taping;Passive range of motion;Patient/family education    PT Next Visit Plan General lumbar mobility gains, core and hip strengthening c light easy progression based off symptoms.  PT Home Exercise Plan Access Code: [email or web address]    Consulted and Agree with Plan of Care Patient             Patient will benefit from skilled  therapeutic intervention in order to improve the following deficits and impairments:  Pain, Difficulty walking, Decreased range of motion, Decreased activity tolerance, Decreased balance, Decreased mobility, Decreased strength, Postural dysfunction, Impaired flexibility  Visit Diagnosis: Chronic right-sided low back pain with right-sided sciatica  Muscle weakness (generalized)  Difficulty in walking, not elsewhere classified     Problem List Patient Active Problem List   Diagnosis Date Noted   Status post lumbar laminectomy 09/22/2020   Herniation of lumbar intervertebral disc with radiculopathy    Other spondylosis with radiculopathy, lumbar region    Chronic hand pain, right 02/20/2019   Chronic low back pain 02/20/2019   Hyperlipidemia 02/20/2019    Chyrel Masson, PT, DPT, OCS, ATC 11/15/20  8:37 AM    [facility] [address] [place], [place], [address] Phone: [phone]   Fax:  [phone]  Name: Wendy Deleon MRN: [id number] Date of Birth: 10-29-1970

## 2020-11-21 ENCOUNTER — Ambulatory Visit (INDEPENDENT_AMBULATORY_CARE_PROVIDER_SITE_OTHER): Payer: No Typology Code available for payment source | Admitting: Physical Therapy

## 2020-11-21 ENCOUNTER — Encounter: Payer: Self-pay | Admitting: Physical Therapy

## 2020-11-21 ENCOUNTER — Other Ambulatory Visit: Payer: Self-pay

## 2020-11-21 DIAGNOSIS — R262 Difficulty in walking, not elsewhere classified: Secondary | ICD-10-CM | POA: Diagnosis not present

## 2020-11-21 DIAGNOSIS — G8929 Other chronic pain: Secondary | ICD-10-CM

## 2020-11-21 DIAGNOSIS — R296 Repeated falls: Secondary | ICD-10-CM

## 2020-11-21 DIAGNOSIS — M5441 Lumbago with sciatica, right side: Secondary | ICD-10-CM

## 2020-11-21 DIAGNOSIS — M5417 Radiculopathy, lumbosacral region: Secondary | ICD-10-CM | POA: Diagnosis not present

## 2020-11-21 DIAGNOSIS — M6281 Muscle weakness (generalized): Secondary | ICD-10-CM

## 2020-11-21 NOTE — Therapy (Signed)
[facility] [address] [place], [place], [address] Phone: [phone]   Fax:  [phone]  Physical Therapy Treatment  Patient Details  Name: Wendy Deleon MRN: [id number] Date of Birth: [birth year] Referring Provider (PT): Vira Browns, MD   Encounter Date: 11/21/2020   PT End of Session - 11/21/20 1348     Visit Number 4    Number of Visits 13    Date for PT Re-Evaluation 12/29/20    Progress Note Due on Visit 10    PT Start Time 1303    PT Stop Time 1345    PT Time Calculation (min) 42 min    Activity Tolerance Patient tolerated treatment well    Behavior During Therapy [facility] for tasks assessed/performed             Past Medical History:  Diagnosis Date   Allergy    Anxiety    Arthritis    Dysrhythmia    "skips beats" per pcp.    Family history of adverse reaction to anesthesia    father "his body would not wake up from anesthesia, started going into organ failure   History of chicken pox     Past Surgical History:  Procedure Laterality Date   HAND SURGERY Right    2014 x 3,2016   lumbar     DDD-treated by Dr Minus Liberty LAMINECTOMY/DECOMPRESSION MICRODISCECTOMY N/A 09/22/2020   Procedure: MICRODISCECTOMY LEFT L2-3, BIILATERAL MICRODISCECTOMY L4-5 AND L5-S1;  Surgeon: Kerrin Champagne, MD;  Location: [facility] OR;  Service: Orthopedics;  Laterality: N/A;    There were no vitals filed for this visit.   Subjective Assessment - 11/21/20 1306     Subjective Rt hip is still causing a lot of pain.    Pertinent History s/p 09/22/2020 microdiscectomy L2-3 Lt , bilateral L5-S1 laminectomies/decompression, discectomy L5-S1  PMH: allergies, anxiety, dysrhythmia, hand surgery    Diagnostic tests X-ray, MRI    Patient Stated Goals Walk and move without hurting    Currently in Pain? Yes    Pain Score 5     Pain Location Hip    Pain Orientation Right;Lower    Pain Descriptors / Indicators Sore;Sharp;Tightness    Pain Type  Chronic pain    Pain Onset More than a month ago    Pain Frequency Intermittent    Aggravating Factors  walking, bending, lying on Rt side    Pain Relieving Factors medicine, rest                               [facility] Adult PT Treatment/Exercise - 11/21/20 1318       Lumbar Exercises: Stretches   ITB Stretch Right;3 reps;30 seconds    ITB Stretch Limitations trial of standing and supine with supine prefered      Lumbar Exercises: Supine   Ab Set 20 reps;5 seconds    Bridge 5 seconds;20 reps    Other Supine Lumbar Exercises isometric hip abduction with strap 20 x 5 sec hold      Modalities   Modalities Electrical Stimulation      Electrical Stimulation   Electrical Stimulation Location Rt hip/low back    Electrical Stimulation Action IFC    Electrical Stimulation Parameters to tolerance x 15 min with exercises    Electrical Stimulation Goals Pain  PT Short Term Goals - 11/15/20 0818       PT SHORT TERM GOAL #1   Title independent with initial HEP    Time 3    Period Weeks    Status On-going    Target Date 12/01/20      PT SHORT TERM GOAL #2   Title Pt will be able to perform sit to stand with single UE support.    Time 3    Period Weeks    Status On-going    Target Date 12/01/20               PT Long Term Goals - 11/08/20 0949       PT LONG TERM GOAL #1   Title independent with final HEP    Time 6    Period Weeks    Status New      PT LONG TERM GOAL #2   Title FOTO score improved to >/=  50% for improved function    Time 6    Period Weeks    Status New      PT LONG TERM GOAL #3   Title Pt will be able to demonstrate 5/5 RLE strength for improved function    Time 6    Period Weeks    Status New    Target Date 12/29/20      PT LONG TERM GOAL #4   Title Pt will report pain of </= 2/10 with ADL's.    Time 6    Period Weeks    Status New    Target Date 12/29/20      PT LONG TERM GOAL #5    Title Pt will be able to amb community distances with no device with pain </= 2/10.    Time 6    Period Weeks    Status New    Target Date 12/29/20      Additional Long Term Goals   Additional Long Term Goals Yes      PT LONG TERM GOAL #6   Title Pt will be able to lift 10 pounds from floor to counter height with no pain using correct body mechanics.    Time 6    Period Weeks    Status New    Target Date 12/29/20                   Plan - 11/21/20 1349     Clinical Impression Statement Pt continues to report elevated pain in Rt low back and especially hip.  Trial of TFL stretch today with good response in supine noted, and utilized IFC today to see if this is helpful.  Will continue to benefit from PT to maximize function.    Personal Factors and Comorbidities Comorbidity 3+    Comorbidities allergies, anxiety, dysrhythmia, hand surgery    Examination-Activity Limitations Lift;Stand;Stairs;Squat;Sleep;Other;Bend    Examination-Participation Restrictions Other;Community Activity;Driving;Yard Work;Shop    Stability/Clinical Decision Making Stable/Uncomplicated    Rehab Potential Good    PT Frequency 2x / week    PT Duration 6 weeks    PT Treatment/Interventions ADLs/Self Care Home Management;Cryotherapy;Electrical Stimulation;Iontophoresis 4mg /ml Dexamethasone;Moist Heat;Balance training;Therapeutic exercise;Therapeutic activities;Functional mobility training;Stair training;Gait training;Neuromuscular re-education;Manual techniques;Dry needling;Taping;Passive range of motion;Patient/family education    PT Next Visit Plan General lumbar mobility gains, core and hip strengthening c light easy progression based off symptoms, see what MD says    PT Home Exercise Plan Access Code: [email or web address]    Consulted and Agree with Plan of  Care Patient             Patient will benefit from skilled therapeutic intervention in order to improve the following deficits and impairments:  Pain,  Difficulty walking, Decreased range of motion, Decreased activity tolerance, Decreased balance, Decreased mobility, Decreased strength, Postural dysfunction, Impaired flexibility  Visit Diagnosis: Chronic right-sided low back pain with right-sided sciatica  Muscle weakness (generalized)  Difficulty in walking, not elsewhere classified  Radiculopathy, lumbosacral region  Repeated falls     Problem List Patient Active Problem List   Diagnosis Date Noted   Status post lumbar laminectomy 09/22/2020   Herniation of lumbar intervertebral disc with radiculopathy    Other spondylosis with radiculopathy, lumbar region    Chronic hand pain, right 02/20/2019   Chronic low back pain 02/20/2019   Hyperlipidemia 02/20/2019      Clarita Crane, PT, DPT 11/21/20 1:51 PM     [facility] [address] [place], [place], [address] Phone: [phone]   Fax:  [phone]  Name: Wendy Deleon MRN: [id number] Date of Birth: March 15, 1970

## 2020-11-21 NOTE — Patient Instructions (Signed)
Access Code: [email or web address] URL: [email or web address] Date: 11/21/2020 Prepared by: Moshe Cipro  Exercises Seated Hamstring Stretch - 2 x daily - 3 reps - 30 seconds hold Supine Posterior Pelvic Tilt - 2 x daily - 2 sets - 10 reps - 5 seconds hold Supine 90/90 Alternating Heel Touches with Posterior Pelvic Tilt - 2 x daily - 2 sets - 10 reps Supine Figure 4 Piriformis Stretch - 2 x daily - 3 reps - 30 seconds hold Sit to Stand with Counter Support - 2 x daily - 7 x weekly - 1-2 sets - 10 reps - 5 seconds hold Standing Heel Raise with Support - 2 x daily - 7 x weekly - 1 sets - 10 reps - 5 seconds hold Supine ITB Stretch with Strap - 2 x daily - 7 x weekly - 3 reps - 1 sets - 30 sec hold

## 2020-11-23 ENCOUNTER — Ambulatory Visit (INDEPENDENT_AMBULATORY_CARE_PROVIDER_SITE_OTHER): Payer: No Typology Code available for payment source | Admitting: Physical Therapy

## 2020-11-23 ENCOUNTER — Ambulatory Visit (INDEPENDENT_AMBULATORY_CARE_PROVIDER_SITE_OTHER): Payer: No Typology Code available for payment source | Admitting: Specialist

## 2020-11-23 ENCOUNTER — Encounter: Payer: Self-pay | Admitting: Physical Therapy

## 2020-11-23 ENCOUNTER — Encounter: Payer: Self-pay | Admitting: Specialist

## 2020-11-23 ENCOUNTER — Other Ambulatory Visit: Payer: Self-pay

## 2020-11-23 VITALS — BP 123/84 | HR 105 | Ht 64.0 in | Wt 165.0 lb

## 2020-11-23 DIAGNOSIS — M5441 Lumbago with sciatica, right side: Secondary | ICD-10-CM

## 2020-11-23 DIAGNOSIS — R262 Difficulty in walking, not elsewhere classified: Secondary | ICD-10-CM | POA: Diagnosis not present

## 2020-11-23 DIAGNOSIS — M5417 Radiculopathy, lumbosacral region: Secondary | ICD-10-CM | POA: Diagnosis not present

## 2020-11-23 DIAGNOSIS — M7061 Trochanteric bursitis, right hip: Secondary | ICD-10-CM

## 2020-11-23 DIAGNOSIS — M6281 Muscle weakness (generalized): Secondary | ICD-10-CM | POA: Diagnosis not present

## 2020-11-23 DIAGNOSIS — G8929 Other chronic pain: Secondary | ICD-10-CM

## 2020-11-23 DIAGNOSIS — M5136 Other intervertebral disc degeneration, lumbar region: Secondary | ICD-10-CM

## 2020-11-23 DIAGNOSIS — R296 Repeated falls: Secondary | ICD-10-CM

## 2020-11-23 MED ORDER — ETODOLAC 500 MG PO TABS: 500.0000 mg | ORAL_TABLET | Freq: Two times a day (BID) | ORAL | 3 refills | Status: DC

## 2020-11-23 MED ORDER — METHYLPREDNISOLONE ACETATE 40 MG/ML IJ SUSP
80.0000 mg | INTRAMUSCULAR | Status: AC | PRN
  Administered 2020-11-23: 80 mg via INTRA_ARTICULAR

## 2020-11-23 MED ORDER — METHOCARBAMOL 500 MG PO TABS: 500.0000 mg | ORAL_TABLET | Freq: Three times a day (TID) | ORAL | 1 refills | Status: DC | PRN

## 2020-11-23 MED ORDER — BUPIVACAINE HCL 0.5 % IJ SOLN
5.0000 mL | INTRAMUSCULAR | Status: AC | PRN
  Administered 2020-11-23: 5 mL via INTRA_ARTICULAR

## 2020-11-23 NOTE — Progress Notes (Signed)
Office Visit Note   Patient: Wendy Deleon           Date of Birth: Jul 19, 1970           MRN: [id number] Visit Date: 11/23/2020              Requested by: Wynn Banker, MD [address] [place],  [place] [address] PCP: Wynn Banker, MD   Assessment & Plan: 8 weeks post op L2-3 left microdiscectomy and L5-S1 miscrodiscectomy. Visit Diagnoses: No diagnosis found. Incisions are healed, no swelling Motor is normal SLR is negative bilat Tender right greater trochanter. Right trochanteric bursitis Plan: Avoid frequent bending and stooping  No lifting greater than 10 lbs. May use ice or moist heat for pain. Weight loss is of benefit. Best medication for lumbar disc disease is arthritis medications like motrin, celebrex, lodine or etodolac and naprosyn. Exercise is important to improve your indurance and does allow people to function better inspite of back pain. Physical therapy for post op rehab and right hip trochanteric bursitis  Follow-Up Instructions: No follow-ups on file.   Orders:  No orders of the defined types were placed in this encounter.  No orders of the defined types were placed in this encounter.     Procedures: Large Joint Inj: R greater trochanter on 11/23/2020 12:00 PM Indications: pain Details: 22 G 3.5 in needle, lateral approach  Arthrogram: No  Medications: 5 mL bupivacaine 0.5 %; 80 mg methylPREDNISolone acetate 40 MG/ML Outcome: tolerated well, no immediate complications  Bandaid applied. Procedure, treatment alternatives, risks and benefits explained, specific risks discussed. Consent was given by the patient. Immediately prior to procedure a time out was called to verify the correct patient, procedure, equipment, support staff and site/side marked as required. Patient was prepped and draped in the usual sterile fashion.     Clinical Data: No additional findings.   Subjective: Chief Complaint  Patient presents  with  . Lower Back - Routine Post Op    Microdiscectomy Left L2-3, Bilateral Microdiscectomy L4-5, L5-S1    HPI  Review of Systems   Objective: Vital Signs: BP 123/84 (BP Location: Left Arm, Patient Position: Sitting)   Pulse (!) 105   Ht 5\' 4"  (1.626 m)   Wt 165 lb (74.8 kg)   BMI 28.32 kg/m   Physical Exam  Ortho Exam  Specialty Comments:  No specialty comments available.  Imaging: No results found.   PMFS History: Patient Active Problem List   Diagnosis Date Noted  . Status post lumbar laminectomy 09/22/2020  . Herniation of lumbar intervertebral disc with radiculopathy   . Other spondylosis with radiculopathy, lumbar region   . Chronic hand pain, right 02/20/2019  . Chronic low back pain 02/20/2019  . Hyperlipidemia 02/20/2019   Past Medical History:  Diagnosis Date  . Allergy   . Anxiety   . Arthritis   . Dysrhythmia    "skips beats" per pcp.   . Family history of adverse reaction to anesthesia    father "his body would not wake up from anesthesia, started going into organ failure  . History of chicken pox     Family History  Problem Relation Age of Onset  . Arthritis Mother   . Diabetes Mother   . Hypertension Mother   . Kidney disease Mother   . Miscarriages / 04/20/2019 Mother   . Arthritis Father   . Hearing loss Father   . High Cholesterol Father   . Hypertension Father   .  Diabetes Sister   . Alcohol abuse Sister   . Asthma Daughter   . Migraines Daughter   . Learning disabilities Daughter   . Arthritis Maternal Grandmother   . Hearing loss Maternal Grandmother   . Heart attack Maternal Grandmother   . High blood pressure Maternal Grandmother   . Kidney disease Maternal Grandmother   . Stroke Maternal Grandmother 80  . Arthritis Maternal Grandfather   . Hearing loss Maternal Grandfather   . Heart attack Maternal Grandfather   . Heart disease Maternal Grandfather   . High blood pressure Maternal Grandfather   .  Arthritis/Rheumatoid Paternal Grandmother   . Hearing loss Paternal Grandmother   . Heart attack Paternal Grandmother   . Heart disease Paternal Grandmother   . Kidney disease Paternal Grandmother   . Other Paternal Grandfather        no medical history known    Past Surgical History:  Procedure Laterality Date  . HAND SURGERY Right    2014 x 3,2016  . lumbar     DDD-treated by Dr Ethelene Hal  . LUMBAR LAMINECTOMY/DECOMPRESSION MICRODISCECTOMY N/A 09/22/2020   Procedure: MICRODISCECTOMY LEFT L2-3, BIILATERAL MICRODISCECTOMY L4-5 AND L5-S1;  Surgeon: Kerrin Champagne, MD;  Location: [facility] OR;  Service: Orthopedics;  Laterality: N/A;   Social History   Occupational History  . Not on file  Tobacco Use  . Smoking status: Every Day    Packs/day: 1.00    Types: Cigarettes  . Smokeless tobacco: Never  Vaping Use  . Vaping Use: Former  Substance and Sexual Activity  . Alcohol use: No  . Drug use: Never  . Sexual activity: Yes    Partners: Male    Comment: husband with vasectomy

## 2020-11-23 NOTE — Patient Instructions (Signed)
Avoid frequent bending and stooping  No lifting greater than 10 lbs. May use ice or moist heat for pain. Weight loss is of benefit. Best medication for lumbar disc disease is arthritis medications like motrin, celebrex, lodine or etodolac and naprosyn. Exercise is important to improve your indurance and does allow people to function better inspite of back pain. Physical therapy for post op rehab and right hip trochanteric bursitis

## 2020-11-23 NOTE — Therapy (Signed)
[facility] Physical Therapy [address] [place], [place], [address] Phone: [phone]   Fax:  [phone]  Physical Therapy Treatment  Patient Details  Name: Wendy Deleon MRN: [id number] Date of Birth: 1971-01-02 Referring Provider (PT): Vira Browns, MD   Encounter Date: 11/23/2020   PT End of Session - 11/23/20 1510     Visit Number 5    Number of Visits 13    Date for PT Re-Evaluation 12/29/20    Progress Note Due on Visit 10    PT Start Time 1430    PT Stop Time 1510    PT Time Calculation (min) 40 min    Activity Tolerance Patient tolerated treatment well    Behavior During Therapy [facility] for tasks assessed/performed             Past Medical History:  Diagnosis Date   Allergy    Anxiety    Arthritis    Dysrhythmia    "skips beats" per pcp.    Family history of adverse reaction to anesthesia    father "his body would not wake up from anesthesia, started going into organ failure   History of chicken pox     Past Surgical History:  Procedure Laterality Date   HAND SURGERY Right    2014 x 3,2016   lumbar     DDD-treated by Dr Minus Liberty LAMINECTOMY/DECOMPRESSION MICRODISCECTOMY N/A 09/22/2020   Procedure: MICRODISCECTOMY LEFT L2-3, BIILATERAL MICRODISCECTOMY L4-5 AND L5-S1;  Surgeon: Kerrin Champagne, MD;  Location: [facility] OR;  Service: Orthopedics;  Laterality: N/A;    There were no vitals filed for this visit.   Subjective Assessment - 11/23/20 1433     Subjective had an injection in the Rt hip today - feels back recovery may take up to 1 year to improve. felt good leaving last visit after estim, but then leg twitched/spasmed and she fell getting out of car - fine the next day without injury    Pertinent History s/p 09/22/2020 microdiscectomy L2-3 Lt , bilateral L5-S1 laminectomies/decompression, discectomy L5-S1  PMH: allergies, anxiety, dysrhythmia, hand surgery    Diagnostic tests X-ray, MRI    Patient Stated Goals Walk  and move without hurting    Currently in Pain? Yes    Pain Score 5     Pain Location Hip    Pain Orientation Right;Lower    Pain Descriptors / Indicators Aching;Sore;Sharp    Pain Type Chronic pain    Pain Onset More than a month ago    Pain Frequency Intermittent    Aggravating Factors  walking, bending, lying on Rt side    Pain Relieving Factors meds, rest                               [facility] Adult PT Treatment/Exercise - 11/23/20 1435       Lumbar Exercises: Aerobic   Nustep Lvl 4 UE/LE x 8 min      Lumbar Exercises: Standing   Heel Raises 20 reps    Other Standing Lumbar Exercises hip extension and abduction 2x10 reps bil      Lumbar Exercises: Seated   Sit to Stand 10 reps   with 2# medicine ball, 2 sets     Lumbar Exercises: Supine   Ab Set 20 reps;5 seconds    Clam 20 reps    Clam Limitations single limb L3 band; alternating    Bent Knee Raise 20 reps  L3 band                      PT Short Term Goals - 11/15/20 0818       PT SHORT TERM GOAL #1   Title independent with initial HEP    Time 3    Period Weeks    Status On-going    Target Date 12/01/20      PT SHORT TERM GOAL #2   Title Pt will be able to perform sit to stand with single UE support.    Time 3    Period Weeks    Status On-going    Target Date 12/01/20               PT Long Term Goals - 11/08/20 0949       PT LONG TERM GOAL #1   Title independent with final HEP    Time 6    Period Weeks    Status New      PT LONG TERM GOAL #2   Title FOTO score improved to >/=  50% for improved function    Time 6    Period Weeks    Status New      PT LONG TERM GOAL #3   Title Pt will be able to demonstrate 5/5 RLE strength for improved function    Time 6    Period Weeks    Status New    Target Date 12/29/20      PT LONG TERM GOAL #4   Title Pt will report pain of </= 2/10 with ADL's.    Time 6    Period Weeks    Status New    Target Date 12/29/20       PT LONG TERM GOAL #5   Title Pt will be able to amb community distances with no device with pain </= 2/10.    Time 6    Period Weeks    Status New    Target Date 12/29/20      Additional Long Term Goals   Additional Long Term Goals Yes      PT LONG TERM GOAL #6   Title Pt will be able to lift 10 pounds from floor to counter height with no pain using correct body mechanics.    Time 6    Period Weeks    Status New    Target Date 12/29/20                   Plan - 11/23/20 1510     Clinical Impression Statement Pt tolerated increased activity well today without significant increase in pain.  Will continue to benefit from PT to maximize function.    Personal Factors and Comorbidities Comorbidity 3+    Comorbidities allergies, anxiety, dysrhythmia, hand surgery    Examination-Activity Limitations Lift;Stand;Stairs;Squat;Sleep;Other;Bend    Examination-Participation Restrictions Other;Community Activity;Driving;Yard Work;Shop    Stability/Clinical Decision Making Stable/Uncomplicated    Rehab Potential Good    PT Frequency 2x / week    PT Duration 6 weeks    PT Treatment/Interventions ADLs/Self Care Home Management;Cryotherapy;Electrical Stimulation;Iontophoresis 4mg /ml Dexamethasone;Moist Heat;Balance training;Therapeutic exercise;Therapeutic activities;Functional mobility training;Stair training;Gait training;Neuromuscular re-education;Manual techniques;Dry needling;Taping;Passive range of motion;Patient/family education    PT Next Visit Plan General lumbar mobility gains, core and hip strengthening c light easy progression based off symptoms, no estim    PT Home Exercise Plan Access Code: [email or web address]    Consulted and Agree with Plan of Care Patient  Patient will benefit from skilled therapeutic intervention in order to improve the following deficits and impairments:  Pain, Difficulty walking, Decreased range of motion, Decreased activity tolerance,  Decreased balance, Decreased mobility, Decreased strength, Postural dysfunction, Impaired flexibility  Visit Diagnosis: Chronic right-sided low back pain with right-sided sciatica  Muscle weakness (generalized)  Difficulty in walking, not elsewhere classified  Radiculopathy, lumbosacral region  Repeated falls     Problem List Patient Active Problem List   Diagnosis Date Noted   Status post lumbar laminectomy 09/22/2020   Herniation of lumbar intervertebral disc with radiculopathy    Other spondylosis with radiculopathy, lumbar region    Chronic hand pain, right 02/20/2019   Chronic low back pain 02/20/2019   Hyperlipidemia 02/20/2019      Clarita Crane, PT, DPT 11/23/20 3:12 PM     [facility] Physical Therapy [address] [place], [place], [address] Phone: [phone]   Fax:  [phone]  Name: Wendy Deleon MRN: [id number] Date of Birth: August 15, 1970

## 2020-11-27 ENCOUNTER — Encounter: Payer: Self-pay | Admitting: Physical Therapy

## 2020-11-27 ENCOUNTER — Other Ambulatory Visit: Payer: Self-pay

## 2020-11-27 ENCOUNTER — Ambulatory Visit (INDEPENDENT_AMBULATORY_CARE_PROVIDER_SITE_OTHER): Payer: No Typology Code available for payment source | Admitting: Physical Therapy

## 2020-11-27 DIAGNOSIS — G8929 Other chronic pain: Secondary | ICD-10-CM | POA: Diagnosis not present

## 2020-11-27 DIAGNOSIS — R262 Difficulty in walking, not elsewhere classified: Secondary | ICD-10-CM | POA: Diagnosis not present

## 2020-11-27 DIAGNOSIS — M6281 Muscle weakness (generalized): Secondary | ICD-10-CM | POA: Diagnosis not present

## 2020-11-27 DIAGNOSIS — M5441 Lumbago with sciatica, right side: Secondary | ICD-10-CM | POA: Diagnosis not present

## 2020-11-27 NOTE — Therapy (Signed)
[facility] Physical Therapy [address] [place], [place], [address] Phone: [phone]   Fax:  [phone]  Physical Therapy Treatment  Patient Details  Name: Wendy Deleon MRN: [id number] Date of Birth: 10-Aug-1970 Referring Provider (PT): Vira Browns, MD   Encounter Date: 11/27/2020   PT End of Session - 11/27/20 1427     Visit Number 6    Number of Visits 13    Date for PT Re-Evaluation 12/29/20    Progress Note Due on Visit 10    PT Start Time 1345    PT Stop Time 1427    PT Time Calculation (min) 42 min    Activity Tolerance Patient tolerated treatment well    Behavior During Therapy [facility] for tasks assessed/performed             Past Medical History:  Diagnosis Date   Allergy    Anxiety    Arthritis    Dysrhythmia    "skips beats" per pcp.    Family history of adverse reaction to anesthesia    father "his body would not wake up from anesthesia, started going into organ failure   History of chicken pox     Past Surgical History:  Procedure Laterality Date   HAND SURGERY Right    2014 x 3,2016   lumbar     DDD-treated by Dr Minus Liberty LAMINECTOMY/DECOMPRESSION MICRODISCECTOMY N/A 09/22/2020   Procedure: MICRODISCECTOMY LEFT L2-3, BIILATERAL MICRODISCECTOMY L4-5 AND L5-S1;  Surgeon: Kerrin Champagne, MD;  Location: [facility] OR;  Service: Orthopedics;  Laterality: N/A;    There were no vitals filed for this visit.   Subjective Assessment - 11/27/20 1346     Subjective was doing very well after last appt then by the evening pain was severe.  has slowly improved over the past 3 days.    Pertinent History s/p 09/22/2020 microdiscectomy L2-3 Lt , bilateral L5-S1 laminectomies/decompression, discectomy L5-S1  PMH: allergies, anxiety, dysrhythmia, hand surgery    Diagnostic tests X-ray, MRI    Patient Stated Goals Walk and move without hurting    Currently in Pain? Yes    Pain Score 4     Pain Location Hip    Pain Orientation  Right;Lower    Pain Descriptors / Indicators Aching;Sharp;Sore    Pain Type Chronic pain    Pain Onset More than a month ago    Pain Frequency Intermittent    Aggravating Factors  walking, bending, lying on Rt side    Pain Relieving Factors meds, rest                               [facility] Adult PT Treatment/Exercise - 11/27/20 1348       Lumbar Exercises: Aerobic   Nustep Lvl 5 UE/LE x 8 min      Lumbar Exercises: Seated   Long Arc Quad on [employer] sets;10 reps    LAQ on [place] Limitations green ball    Hip Flexion on [employer] Both;20 reps    Hip Flexion on Ball Limitations green ball    Other Seated Lumbar Exercises on green physioball: ant/post pelvic tilts x 20 reps      Lumbar Exercises: Supine   Ab Set 20 reps;5 seconds    Dead Bug 10 reps   bil   Dead Bug Limitations modified with feet down    Isometric Hip Flexion 10 reps   alternating bil with 3 sec  hold     Lumbar Exercises: Sidelying   Clam Both;20 reps                       PT Short Term Goals - 11/15/20 0818       PT SHORT TERM GOAL #1   Title independent with initial HEP    Time 3    Period Weeks    Status On-going    Target Date 12/01/20      PT SHORT TERM GOAL #2   Title Pt will be able to perform sit to stand with single UE support.    Time 3    Period Weeks    Status On-going    Target Date 12/01/20               PT Long Term Goals - 11/08/20 0949       PT LONG TERM GOAL #1   Title independent with final HEP    Time 6    Period Weeks    Status New      PT LONG TERM GOAL #2   Title FOTO score improved to >/=  50% for improved function    Time 6    Period Weeks    Status New      PT LONG TERM GOAL #3   Title Pt will be able to demonstrate 5/5 RLE strength for improved function    Time 6    Period Weeks    Status New    Target Date 12/29/20      PT LONG TERM GOAL #4   Title Pt will report pain of </= 2/10 with ADL's.    Time 6    Period Weeks     Status New    Target Date 12/29/20      PT LONG TERM GOAL #5   Title Pt will be able to amb community distances with no device with pain </= 2/10.    Time 6    Period Weeks    Status New    Target Date 12/29/20      Additional Long Term Goals   Additional Long Term Goals Yes      PT LONG TERM GOAL #6   Title Pt will be able to lift 10 pounds from floor to counter height with no pain using correct body mechanics.    Time 6    Period Weeks    Status New    Target Date 12/29/20                   Plan - 11/27/20 1427     Clinical Impression Statement Session today focused on core stability with good tolerance.  She has difficulty with both feet off of mat so modified exercises to adjust for this.  Will continue to benefit from PT to maximize function and mobility.    Personal Factors and Comorbidities Comorbidity 3+    Comorbidities allergies, anxiety, dysrhythmia, hand surgery    Examination-Activity Limitations Lift;Stand;Stairs;Squat;Sleep;Other;Bend    Examination-Participation Restrictions Other;Community Activity;Driving;Yard Work;Shop    Stability/Clinical Decision Making Stable/Uncomplicated    Rehab Potential Good    PT Frequency 2x / week    PT Duration 6 weeks    PT Treatment/Interventions ADLs/Self Care Home Management;Cryotherapy;Electrical Stimulation;Iontophoresis 4mg /ml Dexamethasone;Moist Heat;Balance training;Therapeutic exercise;Therapeutic activities;Functional mobility training;Stair training;Gait training;Neuromuscular re-education;Manual techniques;Dry needling;Taping;Passive range of motion;Patient/family education    PT Next Visit Plan General lumbar mobility gains, core and hip strengthening -slow progression  as tolerated, no estim    PT Home Exercise Plan Access Code: [email or web address]    Consulted and Agree with Plan of Care Patient             Patient will benefit from skilled therapeutic intervention in order to improve the following deficits  and impairments:  Pain, Difficulty walking, Decreased range of motion, Decreased activity tolerance, Decreased balance, Decreased mobility, Decreased strength, Postural dysfunction, Impaired flexibility  Visit Diagnosis: Chronic right-sided low back pain with right-sided sciatica  Muscle weakness (generalized)  Difficulty in walking, not elsewhere classified     Problem List Patient Active Problem List   Diagnosis Date Noted   Status post lumbar laminectomy 09/22/2020   Herniation of lumbar intervertebral disc with radiculopathy    Other spondylosis with radiculopathy, lumbar region    Chronic hand pain, right 02/20/2019   Chronic low back pain 02/20/2019   Hyperlipidemia 02/20/2019      Clarita Crane, PT, DPT 11/27/20 3:44 PM    [facility] Physical Therapy [address] [place], [place], [address] Phone: [phone]   Fax:  [phone]  Name: Lynn Recendiz MRN: [id number] Date of Birth: 1970-10-02

## 2020-11-29 ENCOUNTER — Ambulatory Visit (INDEPENDENT_AMBULATORY_CARE_PROVIDER_SITE_OTHER): Payer: No Typology Code available for payment source | Admitting: Physical Therapy

## 2020-11-29 ENCOUNTER — Encounter: Payer: Self-pay | Admitting: Family Medicine

## 2020-11-29 ENCOUNTER — Other Ambulatory Visit: Payer: Self-pay

## 2020-11-29 ENCOUNTER — Encounter: Payer: Self-pay | Admitting: Physical Therapy

## 2020-11-29 ENCOUNTER — Ambulatory Visit (INDEPENDENT_AMBULATORY_CARE_PROVIDER_SITE_OTHER): Payer: No Typology Code available for payment source | Admitting: Family Medicine

## 2020-11-29 VITALS — BP 124/84 | HR 107 | Temp 98.8°F | Ht 64.0 in | Wt 165.5 lb

## 2020-11-29 DIAGNOSIS — M5441 Lumbago with sciatica, right side: Secondary | ICD-10-CM

## 2020-11-29 DIAGNOSIS — M6281 Muscle weakness (generalized): Secondary | ICD-10-CM | POA: Diagnosis not present

## 2020-11-29 DIAGNOSIS — M5417 Radiculopathy, lumbosacral region: Secondary | ICD-10-CM | POA: Diagnosis not present

## 2020-11-29 DIAGNOSIS — G8929 Other chronic pain: Secondary | ICD-10-CM | POA: Diagnosis not present

## 2020-11-29 DIAGNOSIS — E785 Hyperlipidemia, unspecified: Secondary | ICD-10-CM | POA: Diagnosis not present

## 2020-11-29 DIAGNOSIS — G4700 Insomnia, unspecified: Secondary | ICD-10-CM

## 2020-11-29 DIAGNOSIS — E538 Deficiency of other specified B group vitamins: Secondary | ICD-10-CM | POA: Diagnosis not present

## 2020-11-29 DIAGNOSIS — M62838 Other muscle spasm: Secondary | ICD-10-CM | POA: Diagnosis not present

## 2020-11-29 DIAGNOSIS — E611 Iron deficiency: Secondary | ICD-10-CM

## 2020-11-29 DIAGNOSIS — R296 Repeated falls: Secondary | ICD-10-CM

## 2020-11-29 DIAGNOSIS — R262 Difficulty in walking, not elsewhere classified: Secondary | ICD-10-CM

## 2020-11-29 LAB — CBC WITH DIFFERENTIAL/PLATELET
Basophils Absolute: 0.1 10*3/uL (ref 0.0–0.1)
Basophils Relative: 0.5 % (ref 0.0–3.0)
Eosinophils Absolute: 0.5 10*3/uL (ref 0.0–0.7)
Eosinophils Relative: 4.1 % (ref 0.0–5.0)
HCT: 40.6 % (ref 36.0–46.0)
Hemoglobin: 13.6 g/dL (ref 12.0–15.0)
Lymphocytes Relative: 24.6 % (ref 12.0–46.0)
Lymphs Abs: 2.9 10*3/uL (ref 0.7–4.0)
MCHC: 33.7 g/dL (ref 30.0–36.0)
MCV: 94 fl (ref 78.0–100.0)
Monocytes Absolute: 0.8 10*3/uL (ref 0.1–1.0)
Monocytes Relative: 6.9 % (ref 3.0–12.0)
Neutro Abs: 7.6 10*3/uL (ref 1.4–7.7)
Neutrophils Relative %: 63.9 % (ref 43.0–77.0)
Platelets: 217 10*3/uL (ref 150.0–400.0)
RBC: 4.31 Mil/uL (ref 3.87–5.11)
RDW: 12.4 % (ref 11.5–15.5)
WBC: 11.9 10*3/uL — ABNORMAL HIGH (ref 4.0–10.5)

## 2020-11-29 LAB — COMPREHENSIVE METABOLIC PANEL
ALT: 14 U/L (ref 0–35)
AST: 13 U/L (ref 0–37)
Albumin: 4.4 g/dL (ref 3.5–5.2)
Alkaline Phosphatase: 96 U/L (ref 39–117)
BUN: 19 mg/dL (ref 6–23)
CO2: 25 mEq/L (ref 19–32)
Calcium: 9.7 mg/dL (ref 8.4–10.5)
Chloride: 102 mEq/L (ref 96–112)
Creatinine, Ser: 0.75 mg/dL (ref 0.40–1.20)
GFR: 93.92 mL/min (ref >60.00)
Glucose, Bld: 96 mg/dL (ref 70–99)
Potassium: 4.3 mEq/L (ref 3.5–5.1)
Sodium: 136 mEq/L (ref 135–145)
Total Bilirubin: 0.6 mg/dL (ref 0.2–1.2)
Total Protein: 7.4 g/dL (ref 6.0–8.3)

## 2020-11-29 LAB — IBC + FERRITIN
Ferritin: 50.2 ng/mL (ref 10.0–291.0)
Iron: 103 ug/dL (ref 42–145)
Saturation Ratios: 26.9 % (ref 20.0–50.0)
TIBC: 383.6 ug/dL (ref 250.0–450.0)
Transferrin: 274 mg/dL (ref 212.0–360.0)

## 2020-11-29 LAB — VITAMIN B12: Vitamin B-12: 390 pg/mL (ref 211–911)

## 2020-11-29 LAB — LIPID PANEL
Cholesterol: 301 mg/dL — ABNORMAL HIGH (ref 0–200)
HDL: 65.6 mg/dL (ref >39.00)
LDL Cholesterol: 212 mg/dL — ABNORMAL HIGH (ref 0–99)
NonHDL: 235.87
Total CHOL/HDL Ratio: 5
Triglycerides: 118 mg/dL (ref 0.0–149.0)
VLDL: 23.6 mg/dL (ref 0.0–40.0)

## 2020-11-29 LAB — FOLATE: Folate: 6.5 ng/mL (ref >5.9)

## 2020-11-29 LAB — TSH: TSH: 1.36 u[IU]/mL (ref 0.35–5.50)

## 2020-11-29 NOTE — Progress Notes (Signed)
Wendy Deleon DOB: 11-23-1970 Encounter date: 11/29/2020  This is a 50 y.o. female who presents with Chief Complaint  Patient presents with   Pain    History of present illness: Microdiscectomy L2-3, 4-5 in august.   Through everything with back bot bursitis in hip - got shot and felt great until Thursday night then in so much pain she couldn't walk, but by Saturday felt better.   Back still hurts. Sometimes depends on what she is doing. Just nagging pain that won't go away. Can't sleep - gets an hour, then wakes up. Feel like panic attack. Just can't stand to be touched then. Then cries. Last night was first night she slept for more than an hour at a time for over a week. Doc gave her muscle relaxers that don't help at all. Getting twinges in hip, pain in right hand. "I just want my life back". Took melatonin last night which also helped. Pain up to 5-6 during day. Took 4 advil this morning - pain is about 3/10. Getting catch in right lower back. Surgeon told her it could take up to year to heal. She doesn't have pain medication left. She is taking anti-inflammatory since getting bursitis shot. Does feel that she sleeps better on melatonin.   Anxiety is not good either. Not sure what causes it. Started after she was given gabapentin for hand pain after saw injury.   Eyelid has been twitching.   Energy level low.   Allergies  Allergen Reactions   Cyclobenzaprine Anaphylaxis and Shortness Of Breath    Tongue swelling, throat burning, "feels weird to breathe"   Tizanidine Anaphylaxis and Shortness Of Breath    Tongue swelling, throat burning, "feels weird to breathe"   Eggs Or Egg-Derived Products Other (See Comments)    Vomiting, stomach swelling and diarrhea   Oxycodone     Feels over-medicated   Tape Rash    adhesive   Current Meds  Medication Sig   augmented betamethasone dipropionate (DIPROLENE-AF) 0.05 % ointment Apply 1 application topically 2 (two) times daily  as needed (blisters).   cetirizine (ZYRTEC) 10 MG tablet Take 10 mg by mouth daily.   diazepam (VALIUM) 5 MG tablet Take 1 tablet (5 mg total) by mouth every 12 (twelve) hours as needed for muscle spasms.   docusate sodium (COLACE) 100 MG capsule Take 1 capsule (100 mg total) by mouth 2 (two) times daily.   etodolac (LODINE) 500 MG tablet Take 1 tablet (500 mg total) by mouth 2 (two) times daily.   HYDROcodone-acetaminophen (NORCO) 10-325 MG tablet Take 1 tablet by mouth every 4 (four) hours as needed.   ibuprofen (ADVIL) 200 MG tablet Take 800 mg by mouth every 8 (eight) hours as needed for moderate pain.   methocarbamol (ROBAXIN) 500 MG tablet Take 1 tablet (500 mg total) by mouth every 8 (eight) hours as needed for muscle spasms.   Multiple Vitamins-Minerals (MULTIVITAMIN ADULTS PO) Take 1 tablet by mouth daily.   Norethindrone-Ethinyl Estradiol-Fe Biphas (LO LOESTRIN FE) 1 MG-10 MCG / 10 MCG tablet Take 1 tablet by mouth daily.   Probiotic Product (PROBIOTIC PO) Take by mouth.   rosuvastatin (CRESTOR) 10 MG tablet Take 1 tablet (10 mg total) by mouth daily.   simethicone (MYLICON) 125 MG chewable tablet Chew 125 mg by mouth daily as needed for flatulence.   [DISCONTINUED] pravastatin (PRAVACHOL) 40 MG tablet TAKE 1 TABLET BY MOUTH EVERY DAY (Patient taking differently: Take 40 mg by mouth daily.)  Review of Systems  Constitutional:  Positive for fatigue. Negative for chills and fever.  Respiratory:  Negative for cough, chest tightness, shortness of breath and wheezing.   Cardiovascular:  Negative for chest pain, palpitations and leg swelling.  Musculoskeletal:  Positive for back pain and gait problem (when back is flared).  Psychiatric/Behavioral:  The patient is nervous/anxious.    Objective:  BP 124/84 (BP Location: Right Arm, Patient Position: Sitting, Cuff Size: Normal)   Pulse (!) 107   Temp 98.8 F (37.1 C) (Oral)   Ht 5\' 4"  (1.626 m)   Wt 165 lb 8 oz (75.1 kg)   BMI 28.41  kg/m   Weight: 165 lb 8 oz (75.1 kg)   BP Readings from Last 3 Encounters:  11/29/20 124/84  11/23/20 123/84  10/18/20 129/80   Wt Readings from Last 3 Encounters:  11/29/20 165 lb 8 oz (75.1 kg)  11/23/20 165 lb (74.8 kg)  10/18/20 165 lb (74.8 kg)    Physical Exam Constitutional:      General: She is not in acute distress.    Appearance: She is well-developed.  Cardiovascular:     Rate and Rhythm: Normal rate and regular rhythm.     Heart sounds: Normal heart sounds. No murmur heard.   No friction rub.  Pulmonary:     Effort: Pulmonary effort is normal. No respiratory distress.     Breath sounds: Normal breath sounds. No wheezing or rales.  Musculoskeletal:     Right lower leg: No edema.     Left lower leg: No edema.     Comments: Although gait is slow, she is walking now without walker.   Neurological:     Mental Status: She is alert and oriented to person, place, and time.  Psychiatric:        Attention and Perception: Attention normal.        Mood and Affect: Mood normal.        Behavior: Behavior normal.   Depression screen [facility] 2/9 03/31/2020 11/03/2019  Decreased Interest 0 0  Down, Depressed, Hopeless 0 0  PHQ - 2 Score 0 0  Altered sleeping - 1  Tired, decreased energy - 2  Change in appetite - 0  Feeling bad or failure about yourself  - 0  Trouble concentrating - 0  Moving slowly or fidgety/restless - 0  Suicidal thoughts - 0  PHQ-9 Score - 3     Assessment/Plan  1. Chronic low back pain with right-sided sciatica, unspecified back pain laterality This is slowly improving. She is following with specialist. We discussed consideration for medication like cymbalta to help with nerve pain as well as anxiety, but with her improving and having a decent sleep last night with melatonin, we discussed trying that first before adding medication.   2. Muscle spasm She Wendy work through PT and stretching as directed. She has been fairly limited with motion since  having the surgery, and some of muscle spasms she has currently is likely related to inactivity and muscles trying to stabilize area of surgery. - TSH; Future - TSH  3. Hyperlipidemia, unspecified hyperlipidemia type She was switched from pravastatin to Crestor for better cholesterol control. - Comprehensive metabolic panel; Future - Lipid panel; Future - Lipid panel - Comprehensive metabolic panel - Lipid panel; Future  4. B12 deficiency - Vitamin B12; Future - Folate; Future - Folate - Vitamin B12  5. Iron deficiency - CBC with Differential/Platelet; Future - IBC + Ferritin; Future - IBC +  Ferritin - CBC with Differential/Platelet  6. Insomnia: She was able to get a good night sleep with melatonin last night.  Discussed that if this is helpful for her, this would be preferred over adding other medication.  I do feel that anxiety would be better if sleep is better.  Energy level Wendy clearly also be better.  Encouraged her to try regular melatonin first and reach out if she does not feel that this is helping with overall anxiety and mood.   Return if symptoms worsen or fail to improve.      Theodis Shove, MD

## 2020-11-29 NOTE — Patient Instructions (Signed)
Let me know how sleep and anxiety are doing in 2 weeks time. If you feel anxiety is worse in the meanwhile let me know.

## 2020-11-29 NOTE — Therapy (Signed)
[facility] Physical Therapy [address] [place], [place], [address] Phone: [phone]   Fax:  [phone]  Physical Therapy Treatment  Patient Details  Name: Wendy Deleon MRN: [id number] Date of Birth: 01-Nov-1970 Referring Provider (PT): Vira Browns, MD   Encounter Date: 11/29/2020   PT End of Session - 11/29/20 1514     Visit Number 7    Number of Visits 13    Date for PT Re-Evaluation 12/29/20    Progress Note Due on Visit 10    PT Start Time 1435    PT Stop Time 1513    PT Time Calculation (min) 38 min    Activity Tolerance Patient tolerated treatment well    Behavior During Therapy [facility] for tasks assessed/performed             Past Medical History:  Diagnosis Date   Allergy    Anxiety    Arthritis    Dysrhythmia    "skips beats" per pcp.    Family history of adverse reaction to anesthesia    father "his body would not wake up from anesthesia, started going into organ failure   History of chicken pox     Past Surgical History:  Procedure Laterality Date   HAND SURGERY Right    2014 x 3,2016   lumbar     DDD-treated by Dr Minus Liberty LAMINECTOMY/DECOMPRESSION MICRODISCECTOMY N/A 09/22/2020   Procedure: MICRODISCECTOMY LEFT L2-3, BIILATERAL MICRODISCECTOMY L4-5 AND L5-S1;  Surgeon: Kerrin Champagne, MD;  Location: [facility] OR;  Service: Orthopedics;  Laterality: N/A;    There were no vitals filed for this visit.   Subjective Assessment - 11/29/20 1436     Subjective still having trouble with sleeping, PCP recommending melatonin.    Pertinent History s/p 09/22/2020 microdiscectomy L2-3 Lt , bilateral L5-S1 laminectomies/decompression, discectomy L5-S1  PMH: allergies, anxiety, dysrhythmia, hand surgery    Diagnostic tests X-ray, MRI    Patient Stated Goals Walk and move without hurting    Currently in Pain? Yes    Pain Score 4     Pain Location Hip    Pain Orientation Right;Lower    Pain Descriptors / Indicators  Aching;Sharp;Sore    Pain Type Chronic pain    Pain Onset More than a month ago    Pain Frequency Intermittent    Aggravating Factors  walking, bending, lying on Rt side    Pain Relieving Factors meds, rest                               [facility] Adult PT Treatment/Exercise - 11/29/20 1438       Lumbar Exercises: Aerobic   Nustep Lvl 6 UE/LE x 8 min      Lumbar Exercises: Machines for Strengthening   Leg Press 100# 3x10; seat 1      Lumbar Exercises: Standing   Heel Raises 20 reps    Row Both;20 reps;Theraband    Theraband Level (Row) Level 3 (Green)    Other Standing Lumbar Exercises forward step up on 4" step 2x10 bil      Lumbar Exercises: Seated   Other Seated Lumbar Exercises seated SLR x10 reps bil      Lumbar Exercises: Supine   Ab Set 20 reps;5 seconds    Clam 20 reps    Clam Limitations single limb L3 band; alternating    Bridge 5 seconds;20 reps  PT Short Term Goals - 11/15/20 0818       PT SHORT TERM GOAL #1   Title independent with initial HEP    Time 3    Period Weeks    Status On-going    Target Date 12/01/20      PT SHORT TERM GOAL #2   Title Pt will be able to perform sit to stand with single UE support.    Time 3    Period Weeks    Status On-going    Target Date 12/01/20               PT Long Term Goals - 11/08/20 0949       PT LONG TERM GOAL #1   Title independent with final HEP    Time 6    Period Weeks    Status New      PT LONG TERM GOAL #2   Title FOTO score improved to >/=  50% for improved function    Time 6    Period Weeks    Status New      PT LONG TERM GOAL #3   Title Pt will be able to demonstrate 5/5 RLE strength for improved function    Time 6    Period Weeks    Status New    Target Date 12/29/20      PT LONG TERM GOAL #4   Title Pt will report pain of </= 2/10 with ADL's.    Time 6    Period Weeks    Status New    Target Date 12/29/20      PT LONG  TERM GOAL #5   Title Pt will be able to amb community distances with no device with pain </= 2/10.    Time 6    Period Weeks    Status New    Target Date 12/29/20      Additional Long Term Goals   Additional Long Term Goals Yes      PT LONG TERM GOAL #6   Title Pt will be able to lift 10 pounds from floor to counter height with no pain using correct body mechanics.    Time 6    Period Weeks    Status New    Target Date 12/29/20                   Plan - 11/29/20 1514     Clinical Impression Statement Pt tolerated session well today and is progressing well with increasing exercises in clinic.  She will cotninue to benefit from PT to maximize function.    Personal Factors and Comorbidities Comorbidity 3+    Comorbidities allergies, anxiety, dysrhythmia, hand surgery    Examination-Activity Limitations Lift;Stand;Stairs;Squat;Sleep;Other;Bend    Examination-Participation Restrictions Other;Community Activity;Driving;Yard Work;Shop    Stability/Clinical Decision Making Stable/Uncomplicated    Rehab Potential Good    PT Frequency 2x / week    PT Duration 6 weeks    PT Treatment/Interventions ADLs/Self Care Home Management;Cryotherapy;Electrical Stimulation;Iontophoresis 4mg /ml Dexamethasone;Moist Heat;Balance training;Therapeutic exercise;Therapeutic activities;Functional mobility training;Stair training;Gait training;Neuromuscular re-education;Manual techniques;Dry needling;Taping;Passive range of motion;Patient/family education    PT Next Visit Plan General lumbar mobility gains, core and hip strengthening -slow progression as tolerated, no estim; check STGs    PT Home Exercise Plan Access Code: [email or web address]    Consulted and Agree with Plan of Care Patient             Patient will benefit from skilled therapeutic intervention in order  to improve the following deficits and impairments:  Pain, Difficulty walking, Decreased range of motion, Decreased activity tolerance,  Decreased balance, Decreased mobility, Decreased strength, Postural dysfunction, Impaired flexibility  Visit Diagnosis: Chronic right-sided low back pain with right-sided sciatica  Muscle weakness (generalized)  Difficulty in walking, not elsewhere classified  Radiculopathy, lumbosacral region  Repeated falls     Problem List Patient Active Problem List   Diagnosis Date Noted   Status post lumbar laminectomy 09/22/2020   Herniation of lumbar intervertebral disc with radiculopathy    Other spondylosis with radiculopathy, lumbar region    Chronic hand pain, right 02/20/2019   Chronic low back pain 02/20/2019   Hyperlipidemia 02/20/2019      Clarita Crane, PT, DPT 11/29/20 3:16 PM     [facility] Physical Therapy [address] [place], [place], [address] Phone: [phone]   Fax:  [phone]  Name: Wendy Deleon MRN: [id number] Date of Birth: 1970-10-01

## 2020-12-01 MED ORDER — ROSUVASTATIN CALCIUM 10 MG PO TABS
10.0000 mg | ORAL_TABLET | Freq: Every day | ORAL | 1 refills | Status: DC
Start: 2020-12-01 — End: 2021-05-30

## 2020-12-04 ENCOUNTER — Encounter: Payer: Self-pay | Admitting: Physical Therapy

## 2020-12-04 ENCOUNTER — Ambulatory Visit (INDEPENDENT_AMBULATORY_CARE_PROVIDER_SITE_OTHER): Payer: No Typology Code available for payment source | Admitting: Physical Therapy

## 2020-12-04 ENCOUNTER — Other Ambulatory Visit: Payer: Self-pay

## 2020-12-04 DIAGNOSIS — M5441 Lumbago with sciatica, right side: Secondary | ICD-10-CM

## 2020-12-04 DIAGNOSIS — M6281 Muscle weakness (generalized): Secondary | ICD-10-CM

## 2020-12-04 DIAGNOSIS — R262 Difficulty in walking, not elsewhere classified: Secondary | ICD-10-CM

## 2020-12-04 DIAGNOSIS — G8929 Other chronic pain: Secondary | ICD-10-CM

## 2020-12-04 NOTE — Therapy (Signed)
[facility] Physical Therapy [address] [place], [place], [address] Phone: [phone]   Fax:  [phone]  Physical Therapy Treatment  Patient Details  Name: Wendy Deleon MRN: [id number] Date of Birth: 1970/03/27 Referring Provider (PT): Basil Dess, MD   Encounter Date: 12/04/2020   PT End of Session - 12/04/20 1416     Visit Number 8    Number of Visits 13    Date for PT Re-Evaluation 12/29/20    Progress Note Due on Visit 10    PT Start Time 6754    PT Stop Time 1416    PT Time Calculation (min) 23 min    Activity Tolerance Patient tolerated treatment well    Behavior During Therapy [facility] for tasks assessed/performed             Past Medical History:  Diagnosis Date   Allergy    Anxiety    Arthritis    Dysrhythmia    "skips beats" per pcp.    Family history of adverse reaction to anesthesia    father "his body would not wake up from anesthesia, started going into organ failure   History of chicken pox     Past Surgical History:  Procedure Laterality Date   HAND SURGERY Right    2014 x 3,2016   lumbar     DDD-treated by Dr Jola Babinski LAMINECTOMY/DECOMPRESSION MICRODISCECTOMY N/A 09/22/2020   Procedure: MICRODISCECTOMY LEFT L2-3, BIILATERAL MICRODISCECTOMY L4-5 AND L5-S1;  Surgeon: Jessy Oto, MD;  Location: [place];  Service: Orthopedics;  Laterality: N/A;    There were no vitals filed for this visit.   Subjective Assessment - 12/04/20 1354     Subjective needs to leave early today.    Pertinent History s/p 09/22/2020 microdiscectomy L2-3 Lt , bilateral L5-S1 laminectomies/decompression, discectomy L5-S1  PMH: allergies, anxiety, dysrhythmia, hand surgery    Diagnostic tests X-ray, MRI    Patient Stated Goals Walk and move without hurting    Currently in Pain? No/denies    Pain Onset More than a month ago                               [facility] Adult PT Treatment/Exercise - 12/04/20 1355        Lumbar Exercises: Stretches   Passive Hamstring Stretch Right;Left;3 reps;30 seconds    Passive Hamstring Stretch Limitations seated    ITB Stretch Right;Left;2 reps;20 seconds    ITB Stretch Limitations supine with strap    Piriformis Stretch Right;Left;2 reps;30 seconds      Lumbar Exercises: Aerobic   Nustep Lvl 6 UE/LE x 8 min      Lumbar Exercises: Standing   Heel Raises 10 reps;5 seconds      Lumbar Exercises: Seated   Sit to Stand --   able to perform without UE support     Lumbar Exercises: Supine   Pelvic Tilt 10 reps;5 seconds    Other Supine Lumbar Exercises 90/90 heel taps x 10 reps with ab set                       PT Short Term Goals - 12/04/20 1416       PT SHORT TERM GOAL #1   Title independent with initial HEP    Time 3    Period Weeks    Status Achieved    Target Date 12/01/20  PT SHORT TERM GOAL #2   Title Pt will be able to perform sit to stand with single UE support.    Time 3    Period Weeks    Status Achieved    Target Date 12/01/20               PT Long Term Goals - 12/04/20 1417       PT LONG TERM GOAL #1   Title independent with final HEP    Time 6    Period Weeks    Status On-going    Target Date 12/29/20      PT LONG TERM GOAL #2   Title FOTO score improved to >/=  50% for improved function    Time 6    Period Weeks    Status On-going      PT LONG TERM GOAL #3   Title Pt will be able to demonstrate 5/5 RLE strength for improved function    Time 6    Period Weeks    Status On-going      PT LONG TERM GOAL #4   Title Pt will report pain of </= 2/10 with ADL's.    Time 6    Period Weeks    Status On-going      PT LONG TERM GOAL #5   Title Pt will be able to amb community distances with no device with pain </= 2/10.    Time 6    Period Weeks    Status On-going      PT LONG TERM GOAL #6   Title Pt will be able to lift 10 pounds from floor to counter height with no pain using correct body  mechanics.    Time 6    Period Weeks    Status On-going                   Plan - 12/04/20 1417     Clinical Impression Statement Pt has met both STGs and is progressing well with PT.  Having more days of decreased/no pain with improved functional mobility.  Will continue to benefit from PT to maximize function.    Personal Factors and Comorbidities Comorbidity 3+    Comorbidities allergies, anxiety, dysrhythmia, hand surgery    Examination-Activity Limitations Lift;Stand;Stairs;Squat;Sleep;Other;Bend    Examination-Participation Restrictions Other;Community Activity;Driving;Yard Work;Shop    Stability/Clinical Decision Making Stable/Uncomplicated    Rehab Potential Good    PT Frequency 2x / week    PT Duration 6 weeks    PT Treatment/Interventions ADLs/Self Care Home Management;Cryotherapy;Electrical Stimulation;Iontophoresis 49m/ml Dexamethasone;Moist Heat;Balance training;Therapeutic exercise;Therapeutic activities;Functional mobility training;Stair training;Gait training;Neuromuscular re-education;Manual techniques;Dry needling;Taping;Passive range of motion;Patient/family education    PT Next Visit Plan core and hip strengthening, no estim; general endurance    PT Home Exercise Plan Access Code: [email or web address]    Consulted and Agree with Plan of Care Patient             Patient will benefit from skilled therapeutic intervention in order to improve the following deficits and impairments:  Pain, Difficulty walking, Decreased range of motion, Decreased activity tolerance, Decreased balance, Decreased mobility, Decreased strength, Postural dysfunction, Impaired flexibility  Visit Diagnosis: Chronic right-sided low back pain with right-sided sciatica  Muscle weakness (generalized)  Difficulty in walking, not elsewhere classified     Problem List Patient Active Problem List   Diagnosis Date Noted   Status post lumbar laminectomy 09/22/2020   Herniation of lumbar  intervertebral disc with radiculopathy    Other  spondylosis with radiculopathy, lumbar region    Chronic hand pain, right 02/20/2019   Chronic low back pain 02/20/2019   Hyperlipidemia 02/20/2019      Laureen Abrahams, PT, DPT 12/04/20 2:19 PM     [facility] [address] [place], [place], [address] Phone: [phone]   Fax:  [phone]  Name: Wendy Deleon MRN: [id number] Date of Birth: 1970-11-26

## 2020-12-06 ENCOUNTER — Encounter: Payer: No Typology Code available for payment source | Admitting: Physical Therapy

## 2020-12-07 ENCOUNTER — Other Ambulatory Visit: Payer: Self-pay | Admitting: Family Medicine

## 2020-12-11 ENCOUNTER — Other Ambulatory Visit: Payer: Self-pay

## 2020-12-11 ENCOUNTER — Ambulatory Visit (INDEPENDENT_AMBULATORY_CARE_PROVIDER_SITE_OTHER): Payer: No Typology Code available for payment source | Admitting: Physical Therapy

## 2020-12-11 ENCOUNTER — Encounter: Payer: Self-pay | Admitting: Physical Therapy

## 2020-12-11 DIAGNOSIS — R262 Difficulty in walking, not elsewhere classified: Secondary | ICD-10-CM

## 2020-12-11 DIAGNOSIS — M6281 Muscle weakness (generalized): Secondary | ICD-10-CM

## 2020-12-11 DIAGNOSIS — M5417 Radiculopathy, lumbosacral region: Secondary | ICD-10-CM

## 2020-12-11 DIAGNOSIS — M5441 Lumbago with sciatica, right side: Secondary | ICD-10-CM

## 2020-12-11 DIAGNOSIS — R296 Repeated falls: Secondary | ICD-10-CM

## 2020-12-11 DIAGNOSIS — G8929 Other chronic pain: Secondary | ICD-10-CM

## 2020-12-11 NOTE — Therapy (Signed)
[facility] Physical Therapy [address] [place], [place], [address] Phone: [phone]   Fax:  [phone]  Physical Therapy Treatment Discharge   Patient Details  Name: Wendy Deleon MRN: [id number] Date of Birth: 1970/07/23 Referring Provider (PT): Basil Dess, MD   Encounter Date: 12/11/2020   PT End of Session - 12/11/20 1351     Visit Number 9    Number of Visits 13    Date for PT Re-Evaluation 12/29/20    Progress Note Due on Visit 10    PT Start Time 1340    PT Stop Time 1420    PT Time Calculation (min) 40 min    Activity Tolerance Patient tolerated treatment well    Behavior During Therapy [facility] for tasks assessed/performed             Past Medical History:  Diagnosis Date   Allergy    Anxiety    Arthritis    Dysrhythmia    "skips beats" per pcp.    Family history of adverse reaction to anesthesia    father "his body would not wake up from anesthesia, started going into organ failure   History of chicken pox     Past Surgical History:  Procedure Laterality Date   HAND SURGERY Right    2014 x 3,2016   lumbar     DDD-treated by Dr Jola Babinski LAMINECTOMY/DECOMPRESSION MICRODISCECTOMY N/A 09/22/2020   Procedure: MICRODISCECTOMY LEFT L2-3, BIILATERAL MICRODISCECTOMY L4-5 AND L5-S1;  Surgeon: Jessy Oto, MD;  [facility];  Service: Orthopedics;  Laterality: N/A;    There were no vitals filed for this visit.   Subjective Assessment - 12/11/20 1349     Subjective Pt arriving to therapy reporting too high of a co-pay so she will have to stop coming to therapy due to financial restriants.    Pertinent History s/p 09/22/2020 microdiscectomy L2-3 Lt , bilateral L5-S1 laminectomies/decompression, discectomy L5-S1  PMH: allergies, anxiety, dysrhythmia, hand surgery    Diagnostic tests X-ray, MRI    Patient Stated Goals Walk and move without hurting    Currently in Pain? 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    Referring Provider (PT) Basil Dess, MD      Observation/Other Assessments   Focus on Therapeutic Outcomes (FOTO)  61% (predicted 51%)      AROM   Lumbar Flexion 60    Lumbar Extension 20    Lumbar - Right Side Bend 30    Lumbar - Left Side Bend 42    Lumbar - Right Rotation 100% mobility    Lumbar - Left Rotation 100% mobility      Strength   Right Hip Flexion 5/5    Right Hip Extension 5/5    Right Hip ABduction 5/5    Right Hip ADduction 5/5    Left Hip Flexion 5/5    Left Hip Extension 5/5    Left Hip ABduction 5/5    Left Hip ADduction 5/5      Transfers   Five  time sit to stand comments  12 seconds with no UE support      Ambulation/Gait   Gait Comments Pt amb with no device with step through gait pattern.                           [facility] Adult PT Treatment/Exercise - 12/11/20 0001       Lumbar Exercises: Stretches   Passive Hamstring Stretch Right;Left;3 reps;30 seconds    Passive Hamstring Stretch Limitations seated    ITB Stretch Right;Left;2 reps;20 seconds    ITB Stretch Limitations supine with strap    Piriformis Stretch Right;Left;2 reps;30 seconds      Lumbar Exercises: Aerobic   Nustep Lvl 6 UE/LE x 8 min      Lumbar Exercises: Standing   Heel Raises 10 reps;5 seconds      Lumbar Exercises: Supine   Pelvic Tilt 10 reps;5 seconds                     PT Education - 12/11/20 1415     Education Details updated pt's HEP    Person(s) Educated Patient    Methods Explanation;Demonstration;Handout;Verbal cues    Comprehension Verbalized understanding;Returned demonstration              PT Short Term Goals -  12/11/20 1352       PT SHORT TERM GOAL #1   Title independent with initial HEP    Status Achieved      PT SHORT TERM GOAL #2   Title Pt will be able to perform sit to stand with single UE support.    Status Achieved               PT Long Term Goals - 12/11/20 1352       PT LONG TERM GOAL #1   Title independent with final HEP    Status Achieved      PT LONG TERM GOAL #2   Title FOTO score improved to >/=  50% for improved function    Baseline 61%    Status Achieved      PT LONG TERM GOAL #3   Title Pt will be able to demonstrate 5/5 RLE strength for improved function    Baseline grossly 5/5    Status Achieved      PT LONG TERM GOAL #4   Title Pt will report pain of </= 2/10 with ADL's.    Baseline it depends on the activity    Status Achieved      PT LONG TERM GOAL #5   Title Pt will be able to amb community distances with no device with pain </= 2/10.    Status Achieved      PT LONG TERM GOAL #6   Title Pt will be able to lift 10 pounds from floor to counter height with no pain using correct body mechanics.    Status Achieved                   Plan - 12/11/20 1407     Clinical Impression Statement Pt has met all her STG's and LTG's and reports due to a financial restriant that she can no longer come to skilled therapy. Pt feels she has made progress since beginning therapy. Pt has improved her FOTO from 30% to 61%. Pt's HEP was updated and pt is being discharged fom PT.    Personal Factors and Comorbidities Comorbidity  3+    Comorbidities allergies, anxiety, dysrhythmia, hand surgery    Examination-Activity Limitations Lift;Stand;Stairs;Squat;Sleep;Other;Bend    Examination-Participation Restrictions Other;Community Activity;Driving;Yard Work;Shop    Stability/Clinical Decision Making Stable/Uncomplicated    Rehab Potential Good    PT Frequency 2x / week    PT Duration 6 weeks    PT Treatment/Interventions ADLs/Self Care Home  Management;Cryotherapy;Electrical Stimulation;Iontophoresis 29m/ml Dexamethasone;Moist Heat;Balance training;Therapeutic exercise;Therapeutic activities;Functional mobility training;Stair training;Gait training;Neuromuscular re-education;Manual techniques;Dry needling;Taping;Passive range of motion;Patient/family education    PT Next Visit Plan Discharge pt from PT services    PT Home Exercise Plan Access Code: [email or web address]    Consulted and Agree with Plan of Care Patient             Patient will benefit from skilled therapeutic intervention in order to improve the following deficits and impairments:  Pain, Difficulty walking, Decreased range of motion, Decreased activity tolerance, Decreased balance, Decreased mobility, Decreased strength, Postural dysfunction, Impaired flexibility  Visit Diagnosis: Chronic right-sided low back pain with right-sided sciatica  Muscle weakness (generalized)  Difficulty in walking, not elsewhere classified  Radiculopathy, lumbosacral region  Repeated falls  PHYSICAL THERAPY DISCHARGE SUMMARY  Visits from Start of Care: 9  Current functional level related to goals / functional outcomes: See above   Remaining deficits: See above   Education / Equipment: HEP, body mechanics   Patient agrees to discharge. Patient goals were met. Patient is being discharged due to meeting the stated rehab goals.    Problem List Patient Active Problem List   Diagnosis Date Noted   Status post lumbar laminectomy 09/22/2020   Herniation of lumbar intervertebral disc with radiculopathy    Other spondylosis with radiculopathy, lumbar region    Chronic hand pain, right 02/20/2019   Chronic low back pain 02/20/2019   Hyperlipidemia 02/20/2019    JOretha Caprice PT, MPT 12/11/2020, 2:18 PM  [facility] [address] [place] [address]Phone: [phone]  Fax:  [phone] Name: CAlaija RubleMRN:  [id number] of Birth: [birth year]

## 2020-12-11 NOTE — Patient Instructions (Signed)
Access Code: [email or web address] URL: [email or web address] Date: 12/11/2020 Prepared by: Narda Amber  Exercises Seated Hamstring Stretch - 2 x daily - 3 reps - 30 seconds hold Supine Posterior Pelvic Tilt - 2 x daily - 2 sets - 10 reps - 5 seconds hold Supine 90/90 Alternating Heel Touches with Posterior Pelvic Tilt - 2 x daily - 2 sets - 10 reps Supine Figure 4 Piriformis Stretch - 2 x daily - 3 reps - 30 seconds hold Standing Heel Raise with Support - 2 x daily - 7 x weekly - 1 sets - 10 reps - 5 seconds hold Supine ITB Stretch with Strap - 2 x daily - 7 x weekly - 1 sets - 3 reps - 30 sec hold Seated Piriformis Stretch with Trunk Bend - 2 x daily - 7 x weekly - 30 seconds hold Seated Piriformis Stretch - 2 x daily - 7 x weekly - 3 reps - 30 seconds hold Supine Lower Trunk Rotation - 1 x daily - 7 x weekly - 3 reps - 20 seconds hold

## 2020-12-18 ENCOUNTER — Encounter: Payer: No Typology Code available for payment source | Admitting: Physical Therapy

## 2020-12-20 ENCOUNTER — Encounter: Payer: No Typology Code available for payment source | Admitting: Physical Therapy

## 2020-12-22 ENCOUNTER — Encounter: Payer: Self-pay | Admitting: Specialist

## 2020-12-22 ENCOUNTER — Ambulatory Visit (INDEPENDENT_AMBULATORY_CARE_PROVIDER_SITE_OTHER): Payer: No Typology Code available for payment source | Admitting: Specialist

## 2020-12-22 ENCOUNTER — Other Ambulatory Visit: Payer: Self-pay

## 2020-12-22 VITALS — BP 148/89 | HR 76 | Ht 64.0 in | Wt 165.0 lb

## 2020-12-22 DIAGNOSIS — M5116 Intervertebral disc disorders with radiculopathy, lumbar region: Secondary | ICD-10-CM

## 2020-12-22 DIAGNOSIS — M5416 Radiculopathy, lumbar region: Secondary | ICD-10-CM

## 2020-12-22 DIAGNOSIS — M4316 Spondylolisthesis, lumbar region: Secondary | ICD-10-CM

## 2020-12-22 DIAGNOSIS — M5136 Other intervertebral disc degeneration, lumbar region: Secondary | ICD-10-CM

## 2020-12-22 NOTE — Patient Instructions (Signed)
  Plan: Avoid frequent bending and stooping  No lifting greater than 10 lbs. May use ice or moist heat for pain. Weight loss is of benefit. Best medication for lumbar disc disease is arthritis medications like motrin, celebrex and naprosyn. Exercise is important to improve your indurance and does allow people to function better inspite of back pain. 

## 2020-12-22 NOTE — Progress Notes (Signed)
Post-Op Visit Note   Patient: Wendy Deleon           Date of Birth: 1971/01/24           MRN: [id number] Visit Date: 12/22/2020 PCP: Wynn Banker, MD   Assessment & Plan: 3 mo post op Bilateral L2-3, L4-5 and L5-S1  Chief Complaint:  Chief Complaint  Patient presents with   Lower Back - Follow-up, Routine Post Op    Doing so much better, still can't bend spine without pain, takes Ibuprofen for her pain  Awake, alert and Ox4 Visit Diagnoses:  1. Degenerative disc disease, lumbar   2. Spondylolisthesis, lumbar region   3. Herniation of lumbar intervertebral disc with radiculopathy   4. Left lumbar radiculopathy     Plan: Avoid frequent bending and stooping  No lifting greater than 10 lbs. May use ice or moist heat for pain. Weight loss is of benefit. Best medication for lumbar disc disease is arthritis medications like motrin, celebrex and naprosyn. Exercise is important to improve your indurance and does allow people to function better inspite of back pain.    Follow-Up Instructions: No follow-ups on file.   Orders:  No orders of the defined types were placed in this encounter.  No orders of the defined types were placed in this encounter.   Imaging: No results found.  PMFS History: Patient Active Problem List   Diagnosis Date Noted   Status post lumbar laminectomy 09/22/2020   Herniation of lumbar intervertebral disc with radiculopathy    Other spondylosis with radiculopathy, lumbar region    Chronic hand pain, right 02/20/2019   Chronic low back pain 02/20/2019   Hyperlipidemia 02/20/2019   Past Medical History:  Diagnosis Date   Allergy    Anxiety    Arthritis    Dysrhythmia    "skips beats" per pcp.    Family history of adverse reaction to anesthesia    father "his body would not wake up from anesthesia, started going into organ failure   History of chicken pox     Family History  Problem Relation Age of Onset   Arthritis  Mother    Diabetes Mother    Hypertension Mother    Kidney disease Mother    Miscarriages / [place] Mother    Arthritis Father    Hearing loss Father    High Cholesterol Father    Hypertension Father    Diabetes Sister    Alcohol abuse Sister    Asthma Daughter    Migraines Daughter    Learning disabilities Daughter    Arthritis Maternal Grandmother    Hearing loss Maternal Grandmother    Heart attack Maternal Grandmother    High blood pressure Maternal Grandmother    Kidney disease Maternal Grandmother    Stroke Maternal Grandmother 53   Arthritis Maternal Grandfather    Hearing loss Maternal Grandfather    Heart attack Maternal Grandfather    Heart disease Maternal Grandfather    High blood pressure Maternal Grandfather    Arthritis/Rheumatoid Paternal Grandmother    Hearing loss Paternal Grandmother    Heart attack Paternal Grandmother    Heart disease Paternal Grandmother    Kidney disease Paternal Grandmother    Other Paternal Grandfather        no medical history known    Past Surgical History:  Procedure Laterality Date   HAND SURGERY Right    2014 x 3,2016   lumbar     DDD-treated by Dr  Ramos   LUMBAR LAMINECTOMY/DECOMPRESSION MICRODISCECTOMY N/A 09/22/2020   Procedure: MICRODISCECTOMY LEFT L2-3, BIILATERAL MICRODISCECTOMY L4-5 AND L5-S1;  Surgeon: Kerrin Champagne, MD;  Location: [facility] OR;  Service: Orthopedics;  Laterality: N/A;   Social History   Occupational History   Not on file  Tobacco Use   Smoking status: Every Day    Packs/day: 1.00    Types: Cigarettes   Smokeless tobacco: Never  Vaping Use   Vaping Use: Former  Substance and Sexual Activity   Alcohol use: No   Drug use: Never   Sexual activity: Yes    Partners: Male    Comment: husband with vasectomy

## 2020-12-25 ENCOUNTER — Encounter: Payer: No Typology Code available for payment source | Admitting: Physical Therapy

## 2020-12-27 ENCOUNTER — Encounter: Payer: No Typology Code available for payment source | Admitting: Physical Therapy

## 2021-03-02 ENCOUNTER — Other Ambulatory Visit: Payer: No Typology Code available for payment source

## 2021-03-02 DIAGNOSIS — E785 Hyperlipidemia, unspecified: Secondary | ICD-10-CM

## 2021-03-02 LAB — LIPID PANEL
Cholesterol: 200 mg/dL (ref 0–200)
HDL: 63.7 mg/dL (ref >39.00)
LDL Cholesterol: 122 mg/dL — ABNORMAL HIGH (ref 0–99)
NonHDL: 136.46
Total CHOL/HDL Ratio: 3
Triglycerides: 73 mg/dL (ref 0.0–149.0)
VLDL: 14.6 mg/dL (ref 0.0–40.0)

## 2021-03-25 IMAGING — DX DG CHEST 2V
2 series · 2 of 2 positions shown · non-contrast
Comparison: 11/04/2007

CLINICAL DATA: Chest pain

EXAM:
CHEST - 2 VIEW

[chest pa]
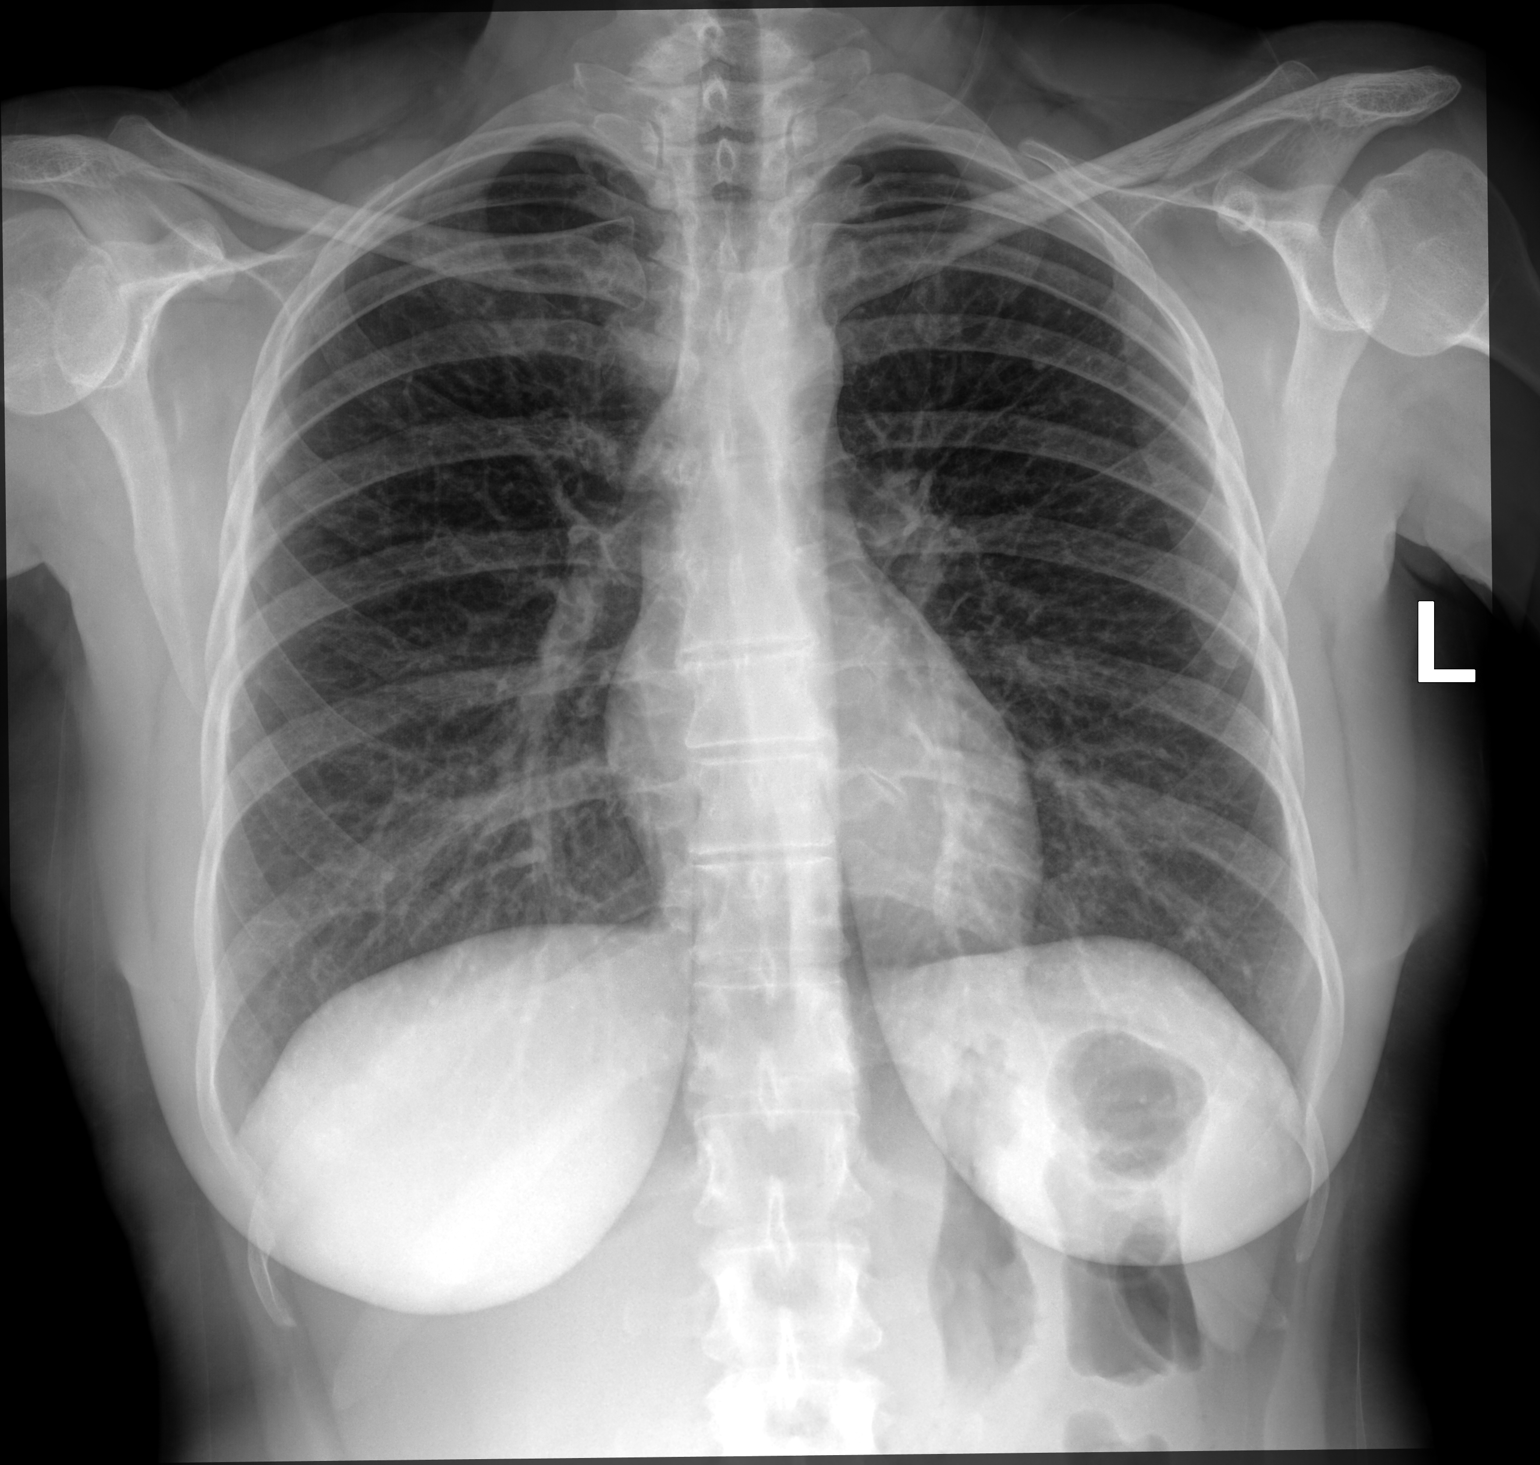

[chest lat]
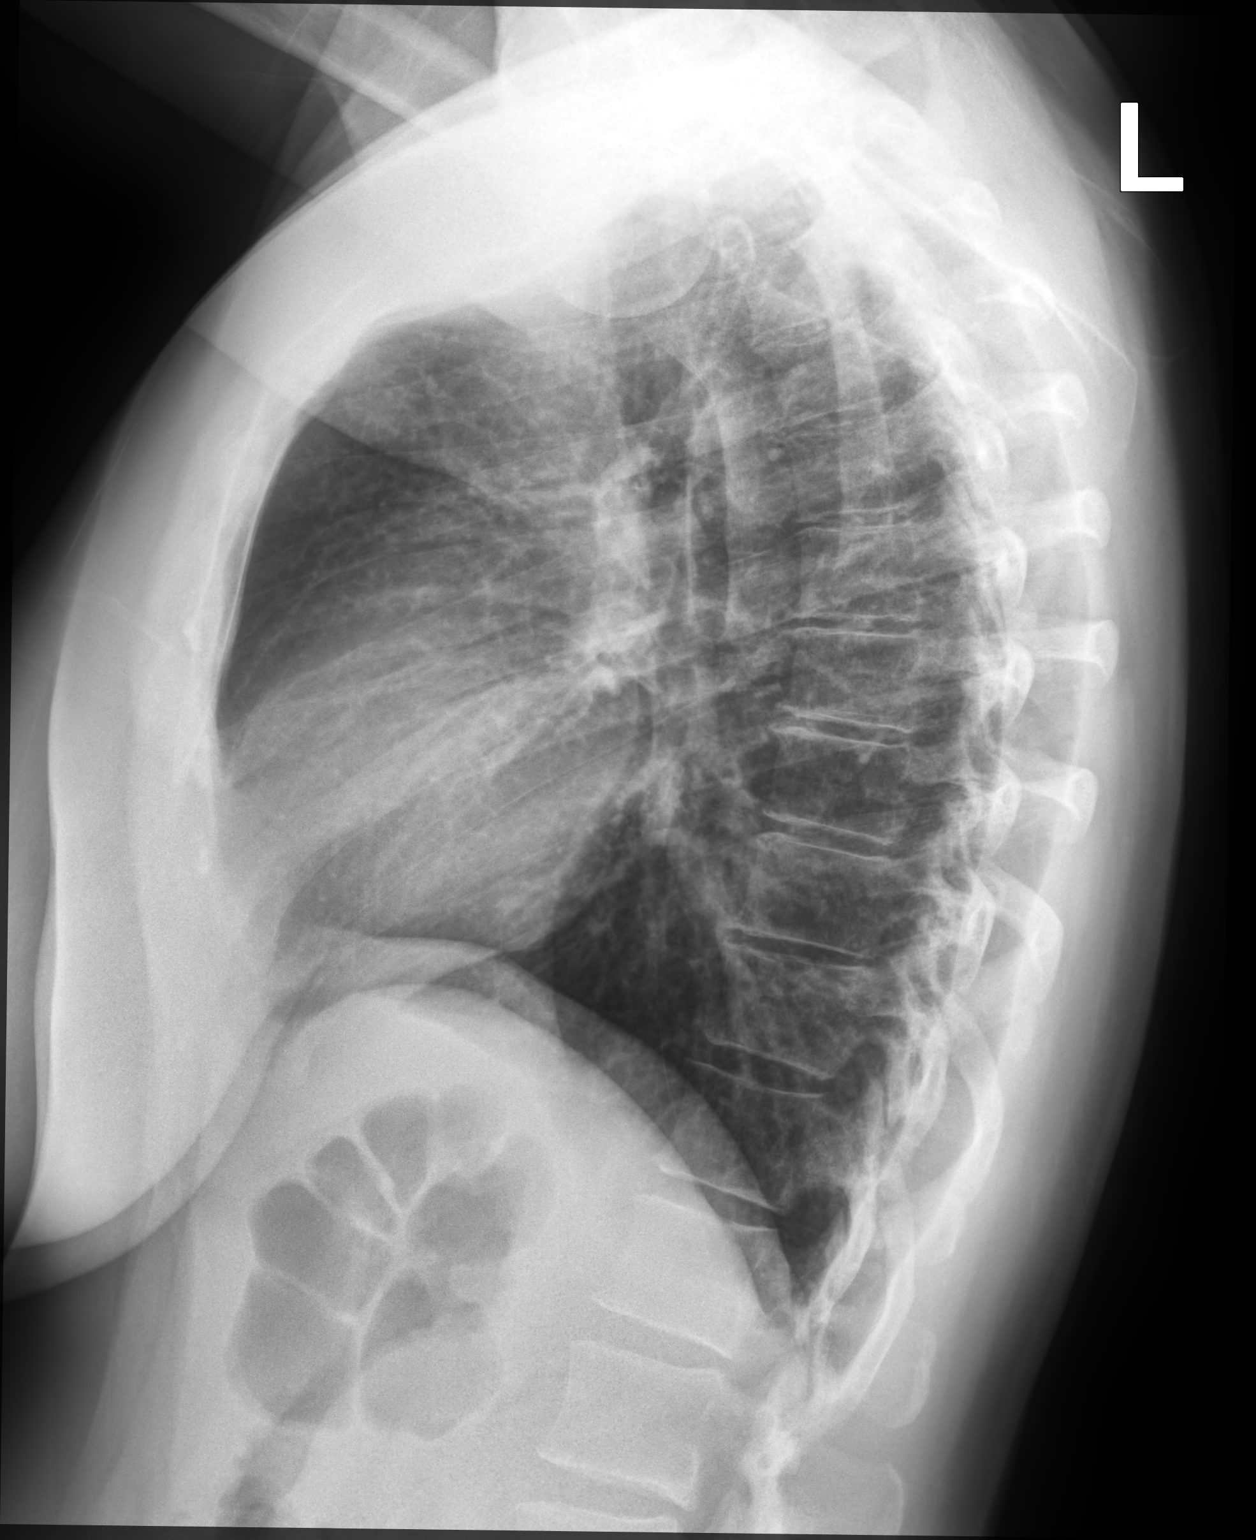

[2 of 2 positions shown; findings below may reference images not displayed]

FINDINGS: The heart size and mediastinal contours are within normal limits. No
focal airspace consolidation, pleural effusion, or pneumothorax. The
visualized skeletal structures are unremarkable.
IMPRESSION: No active cardiopulmonary disease.

## 2021-05-30 ENCOUNTER — Other Ambulatory Visit: Payer: Self-pay | Admitting: Family Medicine

## 2021-08-09 ENCOUNTER — Other Ambulatory Visit: Payer: Self-pay | Admitting: Obstetrics & Gynecology

## 2021-08-09 DIAGNOSIS — Z1231 Encounter for screening mammogram for malignant neoplasm of breast: Secondary | ICD-10-CM

## 2021-08-29 ENCOUNTER — Ambulatory Visit
Admission: RE | Admit: 2021-08-29 | Discharge: 2021-08-29 | Disposition: A | Discharge status: Home / Self Care | Payer: PRIVATE HEALTH INSURANCE | Source: Ambulatory Visit | Attending: Obstetrics & Gynecology | Admitting: Obstetrics & Gynecology

## 2021-08-29 DIAGNOSIS — Z1231 Encounter for screening mammogram for malignant neoplasm of breast: Secondary | ICD-10-CM

## 2021-08-31 ENCOUNTER — Other Ambulatory Visit: Payer: Self-pay | Admitting: Obstetrics & Gynecology

## 2021-08-31 DIAGNOSIS — R928 Other abnormal and inconclusive findings on diagnostic imaging of breast: Secondary | ICD-10-CM

## 2021-09-06 ENCOUNTER — Ambulatory Visit: Payer: PRIVATE HEALTH INSURANCE

## 2021-09-06 ENCOUNTER — Ambulatory Visit
Admission: RE | Admit: 2021-09-06 | Discharge: 2021-09-06 | Disposition: A | Discharge status: Home / Self Care | Payer: PRIVATE HEALTH INSURANCE | Source: Ambulatory Visit | Attending: Obstetrics & Gynecology | Admitting: Obstetrics & Gynecology

## 2021-09-06 DIAGNOSIS — R928 Other abnormal and inconclusive findings on diagnostic imaging of breast: Secondary | ICD-10-CM

## 2021-11-14 ENCOUNTER — Telehealth: Payer: Self-pay | Admitting: Family Medicine

## 2021-11-14 NOTE — Telephone Encounter (Signed)
Called patient to see if she had switched offices or was going to see a new provider here in our office. Patient stated she would stay at our office for the time being. I let patient know she would need a TOC appointment to Dr.Micaheal and she stated that she was not going to schedule one right now and would call back. I informed patient that she currently did not have a PCP and if she needed medication refills if may be difficult to find someone to give the refill. Patient verbalized understanding.       FYI

## 2021-12-31 ENCOUNTER — Encounter: Payer: Self-pay | Admitting: Family Medicine

## 2021-12-31 ENCOUNTER — Ambulatory Visit (INDEPENDENT_AMBULATORY_CARE_PROVIDER_SITE_OTHER): Payer: 59 | Admitting: Family Medicine

## 2021-12-31 VITALS — BP 112/82 | HR 100 | Temp 98.3°F | Ht 64.0 in | Wt 169.1 lb

## 2021-12-31 DIAGNOSIS — E782 Mixed hyperlipidemia: Secondary | ICD-10-CM

## 2021-12-31 DIAGNOSIS — M273 Alveolitis of jaws: Secondary | ICD-10-CM | POA: Diagnosis not present

## 2021-12-31 MED ORDER — IBUPROFEN 800 MG PO TABS: 800.0000 mg | ORAL_TABLET | Freq: Three times a day (TID) | ORAL | 0 refills | Status: DC | PRN

## 2021-12-31 MED ORDER — ROSUVASTATIN CALCIUM 10 MG PO TABS: 10.0000 mg | ORAL_TABLET | Freq: Every day | ORAL | 0 refills | Status: DC

## 2021-12-31 MED ORDER — AMOXICILLIN-POT CLAVULANATE 875-125 MG PO TABS: 1.0000 | ORAL_TABLET | Freq: Two times a day (BID) | ORAL | 0 refills | Status: AC

## 2021-12-31 NOTE — Progress Notes (Signed)
Established Patient Office Visit  Subjective   Patient ID: Wendy Deleon, female    DOB: 1970-06-03  Age: 51 y.o. MRN: [id number]  Chief Complaint  Patient presents with   Ear Pain    Patient complains of right ear pain x1 month, states she also had an abscess in one of her teeth, tooth extraction performed    Facial Pain    Patient complains of facial pain from right ear to the right eye, tried Advil and Zyrtec    Patient had 2 teeth removed 1 1/2 weeks ago, right side, on the bottom. States that she did receive a numbing medication. State she just went to see him on 11/15 for her normal teeth cleaning, dentist said everything looked ok -- that the pain was not coming from her tooth or the extraction. No fever/chills, no changes in hearing, states that there is fullness in the right ear, extremely painful. Has not been given any antibiotics.    Current Outpatient Medications  Medication Instructions   amoxicillin-clavulanate (AUGMENTIN) 875-125 MG tablet 1 tablet, Oral, 2 times daily   cetirizine (ZYRTEC) 10 mg, Oral, Daily   ibuprofen (ADVIL) 800 mg, Oral, Every 8 hours PRN   Multiple Vitamins-Minerals (MULTIVITAMIN ADULTS PO) 1 tablet, Oral, Daily   rosuvastatin (CRESTOR) 10 MG tablet TAKE 1 TABLET BY MOUTH EVERY DAY   simethicone (MYLICON) 0000000 mg, Oral, Daily PRN    Patient Active Problem List   Diagnosis Date Noted   Status post lumbar laminectomy 09/22/2020   Herniation of lumbar intervertebral disc with radiculopathy    Other spondylosis with radiculopathy, lumbar region    Chronic hand pain, right 02/20/2019   Chronic low back pain 02/20/2019   Hyperlipidemia 02/20/2019      Review of Systems  All other systems reviewed and are negative.     Objective:     BP 112/82 (BP Location: Left Arm, Patient Position: Sitting, Cuff Size: Normal)   Pulse 100   Temp 98.3 F (36.8 C) (Oral)   Ht 5\' 4"  (1.626 m)   Wt 169 lb 1.6 oz (76.7 kg)   LMP 12/20/2021    SpO2 98%   BMI 29.03 kg/m    Physical Exam Vitals reviewed.  Constitutional:      Appearance: Normal appearance. She is well-groomed and normal weight.  HENT:     Head:     Comments: Examination of the teeth shows two open areas of the gum on the bottom right posterior jaw where the extractions were performed, there is some surrounding redness but no open draining wounds.    Right Ear: Tympanic membrane and ear canal normal.     Left Ear: Tympanic membrane and ear canal normal.     Mouth/Throat:     Mouth: Mucous membranes are moist.     Pharynx: Oropharynx is clear.  Eyes:     Conjunctiva/sclera: Conjunctivae normal.  Pulmonary:     Effort: Pulmonary effort is normal.     Breath sounds: Normal air entry.  Lymphadenopathy:     Cervical:     Right cervical: No superficial cervical adenopathy.    Left cervical: No superficial cervical adenopathy.  Neurological:     Mental Status: She is alert and oriented to person, place, and time. Mental status is at baseline.     Gait: Gait is intact.  Psychiatric:        Mood and Affect: Mood and affect normal.        Speech: Speech normal.  Behavior: Behavior normal.        Judgment: Judgment normal.      No results found for any visits on 12/31/21.    The 10-year ASCVD risk score (Arnett DK, et al., 2019) is: 2.1%    Assessment & Plan:   Problem List Items Addressed This Visit   None Visit Diagnoses     Dry socket    -  Primary   Relevant Medications   ibuprofen (ADVIL) 800 MG tablet   amoxicillin-clavulanate (AUGMENTIN) 875-125 MG tablet  Questionable, there is no identifiable infection in the ear drum. Patient reports severe pain on the right side of her face in the TM joint, ear and maxillary area. I will give her 7 days of augmentin to treat any dental abscesses. I advised that she call the dentist or the oral surgeon in the morning to discuss her symptoms. Refilled her advil 800 mg every 8 hours as needed.      No follow-ups on file.    Karie Georges, MD

## 2022-01-01 ENCOUNTER — Encounter: Payer: PRIVATE HEALTH INSURANCE | Admitting: Family Medicine

## 2022-03-04 ENCOUNTER — Ambulatory Visit: Payer: 59 | Admitting: Family Medicine

## 2022-03-04 ENCOUNTER — Ambulatory Visit (INDEPENDENT_AMBULATORY_CARE_PROVIDER_SITE_OTHER): Payer: 59 | Admitting: Family Medicine

## 2022-03-04 ENCOUNTER — Encounter: Payer: Self-pay | Admitting: Family Medicine

## 2022-03-04 VITALS — BP 102/78 | HR 76 | Temp 98.9°F | Ht 64.0 in | Wt 163.7 lb

## 2022-03-04 DIAGNOSIS — Z0000 Encounter for general adult medical examination without abnormal findings: Secondary | ICD-10-CM

## 2022-03-04 DIAGNOSIS — Z1211 Encounter for screening for malignant neoplasm of colon: Secondary | ICD-10-CM

## 2022-03-04 DIAGNOSIS — E782 Mixed hyperlipidemia: Secondary | ICD-10-CM | POA: Diagnosis not present

## 2022-03-04 DIAGNOSIS — F17200 Nicotine dependence, unspecified, uncomplicated: Secondary | ICD-10-CM

## 2022-03-04 DIAGNOSIS — K047 Periapical abscess without sinus: Secondary | ICD-10-CM | POA: Diagnosis not present

## 2022-03-04 MED ORDER — CLINDAMYCIN HCL 150 MG PO CAPS: 150.0000 mg | ORAL_CAPSULE | Freq: Three times a day (TID) | ORAL | 0 refills | Status: AC

## 2022-03-04 NOTE — Progress Notes (Signed)
Complete physical exam  Patient: Wendy Deleon   DOB: 11/18/1970   52 y.o. Female  MRN: [id number]  Subjective:    Chief Complaint  Patient presents with   Annual Exam    Wendy Deleon is a 52 y.o. female who presents today for a complete physical exam. She reports consuming a general diet.  Walks the dog daily  She generally feels well. She reports sleeping fairly well. She does not have additional problems to discuss today.    Most recent fall risk assessment:     No data to display           Most recent depression screenings:    12/31/2021    4:09 PM 03/31/2020    8:57 AM  PHQ 2/9 Scores  PHQ - 2 Score 0 0  PHQ- 9 Score 3     Vision: and Dental: Current dental problems and Receives regular dental care  Patient Active Problem List   Diagnosis Date Noted   Tobacco use disorder 03/04/2022   Status post lumbar laminectomy 09/22/2020   Herniation of lumbar intervertebral disc with radiculopathy    Other spondylosis with radiculopathy, lumbar region    Chronic hand pain, right 02/20/2019   Chronic low back pain 02/20/2019   Hyperlipidemia 02/20/2019      Patient Care Team: Farrel Conners, MD as PCP - General (Family Medicine) Vania Rea, MD as Consulting Physician (Obstetrics and Gynecology)   Outpatient Medications Prior to Visit  Medication Sig   cetirizine (ZYRTEC) 10 MG tablet Take 10 mg by mouth daily.   ibuprofen (ADVIL) 800 MG tablet Take 1 tablet (800 mg total) by mouth every 8 (eight) hours as needed for moderate pain.   Multiple Vitamins-Minerals (MULTIVITAMIN ADULTS PO) Take 1 tablet by mouth daily.   rosuvastatin (CRESTOR) 20 MG tablet Take 20 mg by mouth daily.   simethicone (MYLICON) 614 MG chewable tablet Chew 125 mg by mouth daily as needed for flatulence.   [DISCONTINUED] rosuvastatin (CRESTOR) 10 MG tablet Take 1 tablet (10 mg total) by mouth daily.   No facility-administered medications prior to visit.     Review of Systems  All other systems reviewed and are negative.         Objective:     BP 102/78 (BP Location: Left Arm, Patient Position: Sitting, Cuff Size: Normal)   Pulse 76   Temp 98.9 F (37.2 C) (Oral)   Ht 5\' 4"  (1.626 m)   Wt 163 lb 11.2 oz (74.3 kg)   LMP 02/22/2022 (Exact Date)   SpO2 99%   BMI 28.10 kg/m    Physical Exam Vitals reviewed.  Constitutional:      Appearance: Normal appearance. She is well-groomed and normal weight.  HENT:     Right Ear: Tympanic membrane and ear canal normal.     Left Ear: Tympanic membrane and ear canal normal.     Mouth/Throat:     Comments: Right upper jaw shows tooth decay at the posterior molar, there is significant tenderness to palpation of the right upper gumline, no drainage or erythema noted.  Eyes:     Conjunctiva/sclera: Conjunctivae normal.  Neck:     Thyroid: No thyromegaly.  Cardiovascular:     Rate and Rhythm: Normal rate and regular rhythm.     Pulses: Normal pulses.     Heart sounds: S1 normal and S2 normal.  Pulmonary:     Effort: Pulmonary effort is normal.     Breath sounds: Normal  breath sounds and air entry.  Abdominal:     General: Bowel sounds are normal.  Musculoskeletal:     Right lower leg: No edema.     Left lower leg: No edema.  Neurological:     Mental Status: She is alert and oriented to person, place, and time. Mental status is at baseline.     Gait: Gait is intact.  Psychiatric:        Mood and Affect: Mood and affect normal.        Speech: Speech normal.        Behavior: Behavior normal.        Judgment: Judgment normal.      No results found for any visits on 03/04/22.     Assessment & Plan:    Routine Health Maintenance and Physical Exam  Immunization History  Administered Date(s) Administered   PFIZER(Purple Top)SARS-COV-2 Vaccination 09/13/2019, 10/04/2019   Tdap 06/27/2012    Health Maintenance  Topic Date Due   COLONOSCOPY (Pts 45-39yrs Insurance coverage  will need to be confirmed)  Never done   COVID-19 Vaccine (3 - 2023-24 season) 03/20/2022 (Originally 10/12/2021)   INFLUENZA VACCINE  05/13/2022 (Originally 09/11/2021)   Zoster Vaccines- Shingrix (1 of 2) 06/03/2022 (Originally 02/26/2022)   Hepatitis C Screening  01/01/2023 (Originally 02/26/1990)   HIV Screening  01/01/2023 (Originally 02/27/1987)   DTaP/Tdap/Td (2 - Td or Tdap) 06/28/2022   PAP SMEAR-Modifier  06/06/2023   MAMMOGRAM  08/30/2023   HPV VACCINES  Aged Out    Discussed health benefits of physical activity, and encouraged her to engage in regular exercise appropriate for her age and condition.  Problem List Items Addressed This Visit       Unprioritized   Hyperlipidemia - Primary   Relevant Medications   rosuvastatin (CRESTOR) 20 MG tablet   Other Relevant Orders   Lipid panel   Tobacco use disorder (Chronic)    I spent 5 minutes discussing methods for smoking cessation with the patient. She reports she has cut down from 1.5 ppd to 0.5 ppd. She reports she is allergic to the adhesive in the patches. States she will try the nicotine gum instead.       Other Visit Diagnoses     Routine general medical examination at a health care facility       Relevant Orders   Normal physical exam findings except for dental pain and decay right upper jaw, will call in clindamycin 150 mg TID for 7 days, pt encouraged to follow up with her dentist.  Comprehensive metabolic panel   CBC   Colon cancer screening       Relevant Orders   Cologuard   Dental abscess       Relevant Medications   clindamycin (CLEOCIN) 150 MG capsule      Return in 1 year (on 03/05/2023) for Annual Physical exam.     Farrel Conners, MD

## 2022-03-04 NOTE — Assessment & Plan Note (Signed)
I spent 5 minutes discussing methods for smoking cessation with the patient. She reports she has cut down from 1.5 ppd to 0.5 ppd. She reports she is allergic to the adhesive in the patches. States she will try the nicotine gum instead.

## 2022-03-04 NOTE — Patient Instructions (Signed)

## 2022-03-05 LAB — CBC
HCT: 41.4 % (ref 36.0–46.0)
Hemoglobin: 14 g/dL (ref 12.0–15.0)
MCHC: 33.9 g/dL (ref 30.0–36.0)
MCV: 95.2 fl (ref 78.0–100.0)
Platelets: 229 10*3/uL (ref 150.0–400.0)
RBC: 4.34 Mil/uL (ref 3.87–5.11)
RDW: 12.5 % (ref 11.5–15.5)
WBC: 9.9 10*3/uL (ref 4.0–10.5)

## 2022-03-05 LAB — COMPREHENSIVE METABOLIC PANEL
ALT: 20 U/L (ref 0–35)
AST: 22 U/L (ref 0–37)
Albumin: 4.5 g/dL (ref 3.5–5.2)
Alkaline Phosphatase: 109 U/L (ref 39–117)
BUN: 15 mg/dL (ref 6–23)
CO2: 26 mEq/L (ref 19–32)
Calcium: 10.1 mg/dL (ref 8.4–10.5)
Chloride: 103 mEq/L (ref 96–112)
Creatinine, Ser: 0.61 mg/dL (ref 0.40–1.20)
GFR: 104.54 mL/min (ref >60.00)
Glucose, Bld: 70 mg/dL (ref 70–99)
Potassium: 3.9 mEq/L (ref 3.5–5.1)
Sodium: 139 mEq/L (ref 135–145)
Total Bilirubin: 0.5 mg/dL (ref 0.2–1.2)
Total Protein: 7.6 g/dL (ref 6.0–8.3)

## 2022-03-05 LAB — LIPID PANEL
Cholesterol: 196 mg/dL (ref 0–200)
HDL: 67.9 mg/dL (ref >39.00)
LDL Cholesterol: 109 mg/dL — ABNORMAL HIGH (ref 0–99)
NonHDL: 128.36
Total CHOL/HDL Ratio: 3
Triglycerides: 98 mg/dL (ref 0.0–149.0)
VLDL: 19.6 mg/dL (ref 0.0–40.0)

## 2022-03-30 IMAGING — MG MM DIGITAL SCREENING BILAT W/ TOMO AND CAD
8 series · 9 of 24 positions shown · non-contrast
Comparison: Previous exam(s).

CLINICAL DATA: Screening.

EXAM:
DIGITAL SCREENING BILATERAL MAMMOGRAM WITH TOMOSYNTHESIS AND CAD
TECHNIQUE: Bilateral screening digital craniocaudal and mediolateral oblique
mammograms were obtained. Bilateral screening digital breast
tomosynthesis was performed. The images were evaluated with
computer-aided detection.

[L MLO synth-2D]
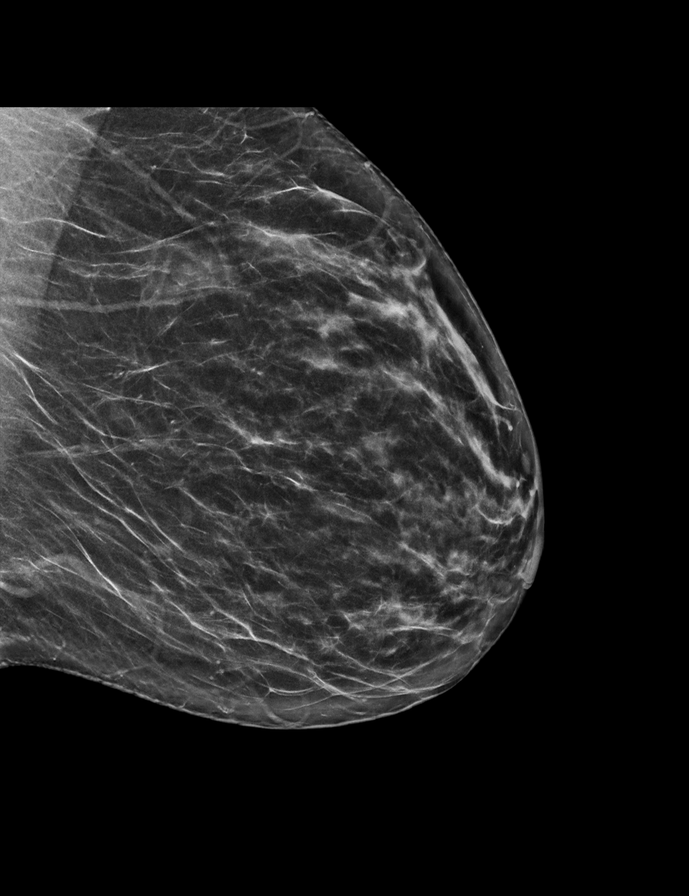

[R CC synth-2D]
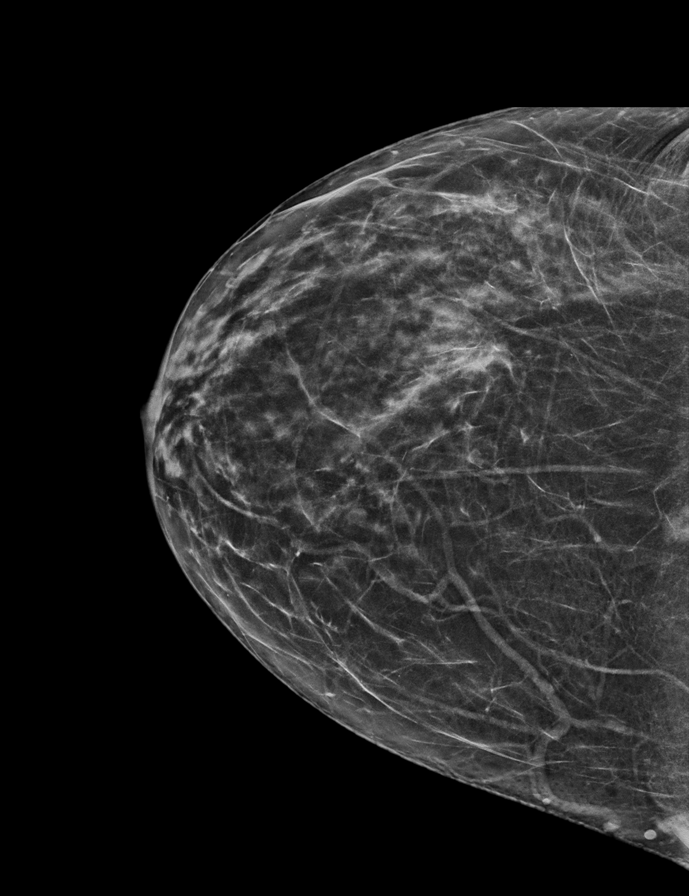

[R MLO synth-2D]
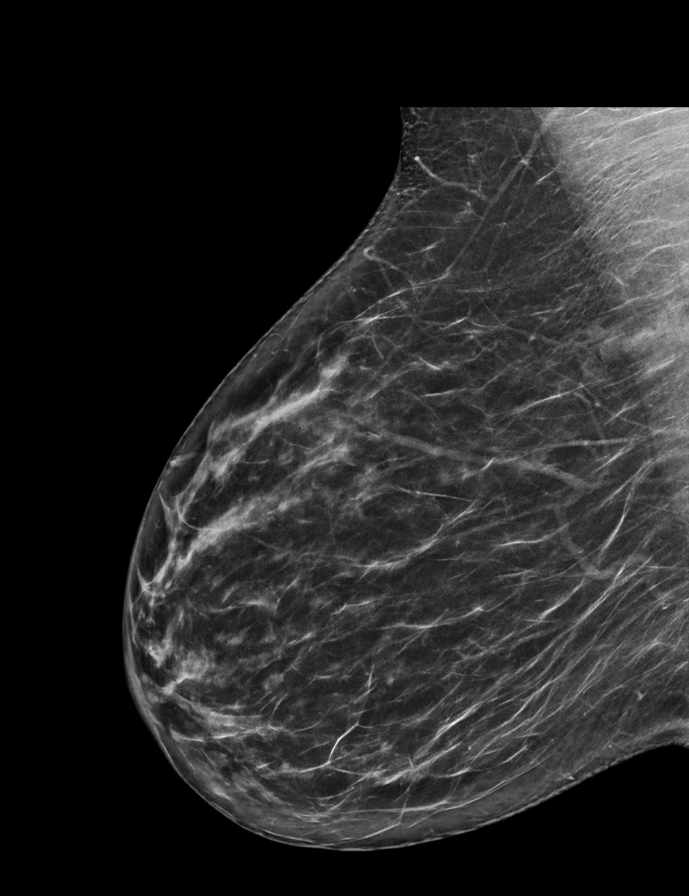

[L CC synth-2D]
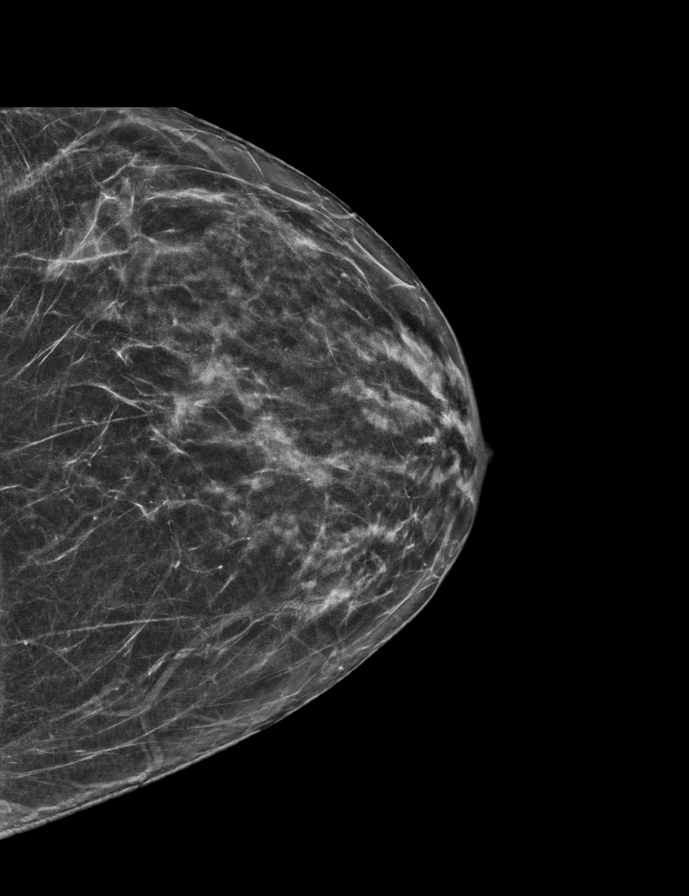

[L MLO tomo · 2 of 64 frames shown]
[frame 21/64]
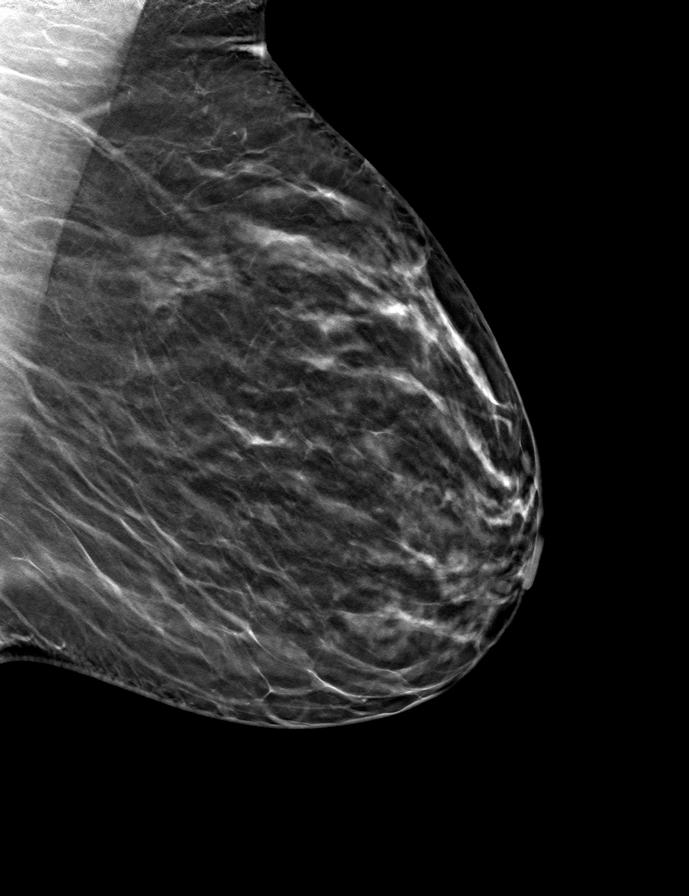
[frame 33/64]
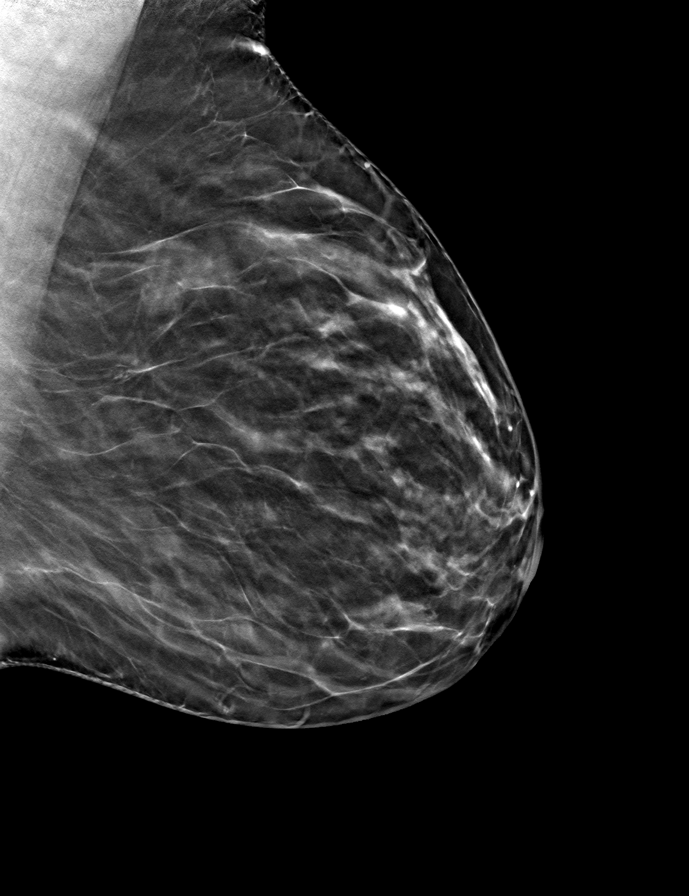

[R CC tomo · tomo slice 31/60.0]
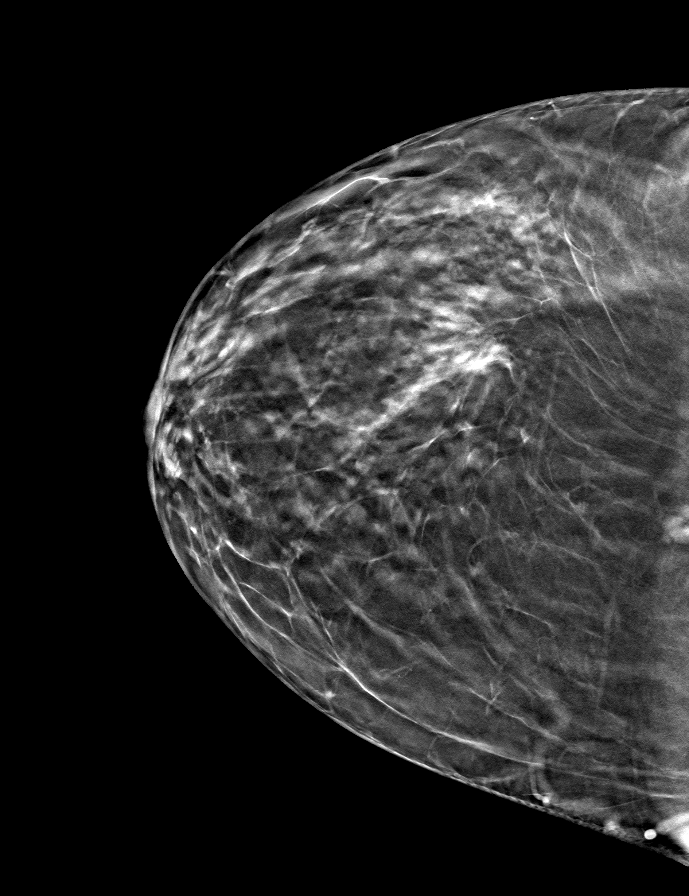

[R MLO tomo · tomo slice 34/67.0]
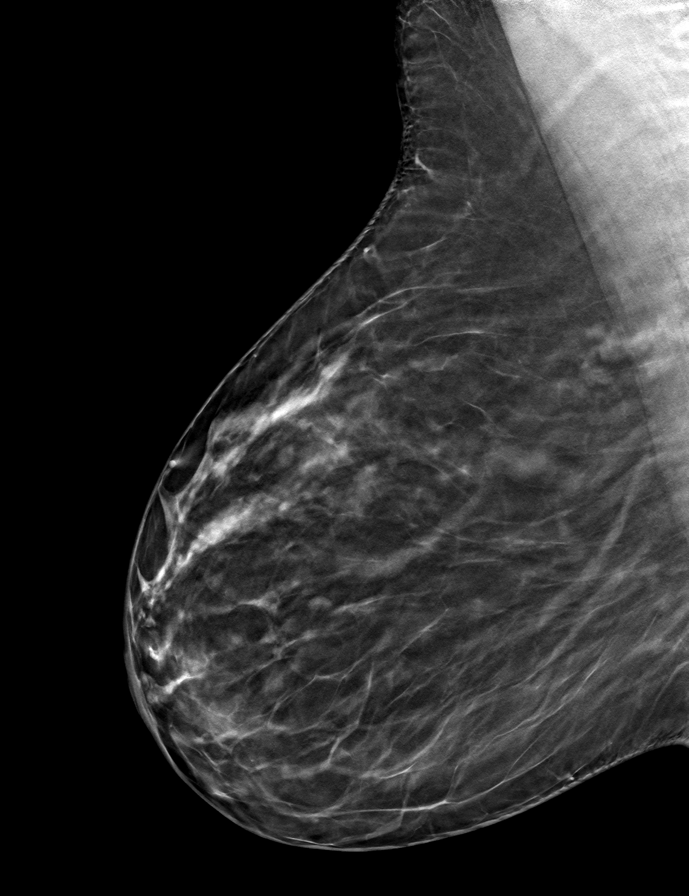

[L CC tomo · tomo slice 29/58.0]
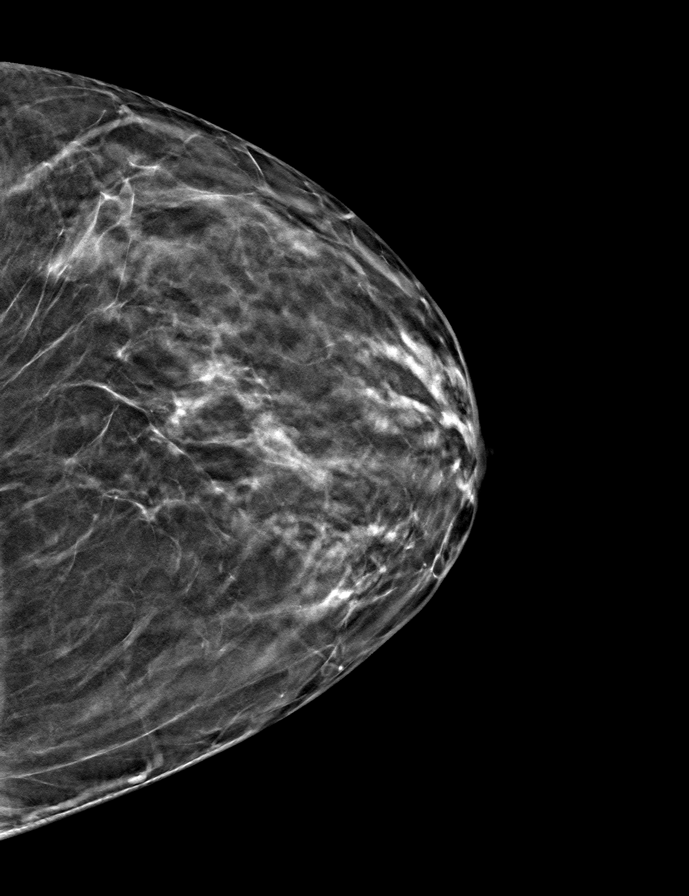

[9 of 24 positions shown; findings below may reference images not displayed]

ACR Breast Density Category b: There are scattered areas of
fibroglandular density.
FINDINGS: There are no findings suspicious for malignancy. The images were
evaluated with computer-aided detection.
IMPRESSION: No mammographic evidence of malignancy. A result letter of this
screening mammogram will be mailed directly to the patient.

RECOMMENDATION:
Screening mammogram in one year. (Code:[id number]-I-BG6)

BI-RADS CATEGORY  1: Negative.

## 2022-06-06 ENCOUNTER — Encounter: Payer: Self-pay | Admitting: Family Medicine

## 2022-11-15 ENCOUNTER — Other Ambulatory Visit: Payer: Self-pay | Admitting: Family Medicine

## 2022-11-15 DIAGNOSIS — Z1211 Encounter for screening for malignant neoplasm of colon: Secondary | ICD-10-CM

## 2022-11-15 DIAGNOSIS — Z1212 Encounter for screening for malignant neoplasm of rectum: Secondary | ICD-10-CM

## 2023-01-13 LAB — COLOGUARD: COLOGUARD: POSITIVE — AB

## 2023-01-14 ENCOUNTER — Other Ambulatory Visit: Payer: Self-pay | Admitting: Family Medicine

## 2023-01-14 DIAGNOSIS — R195 Other fecal abnormalities: Secondary | ICD-10-CM

## 2023-01-17 ENCOUNTER — Telehealth: Payer: Self-pay | Admitting: Family Medicine

## 2023-01-17 NOTE — Telephone Encounter (Signed)
Pt is calling and per pt her cologuard result  said positive and maybe polyps and pt would like a callback and pt would like to have colonoscopy if need be   and would like to dr dr Loreta Ave phone number [phone]

## 2023-01-23 ENCOUNTER — Telehealth: Payer: Self-pay | Admitting: Family Medicine

## 2023-01-23 NOTE — Telephone Encounter (Signed)
Keeps getting text from [facility] for a message about a referral. System shows #[id number] sending her to [facility]. Says she wanted to see Dr Loreta Ave at [facility]

## 2023-02-06 NOTE — Telephone Encounter (Signed)
Patient would like a call back today regarding her referral with dr man

## 2023-02-10 ENCOUNTER — Ambulatory Visit (INDEPENDENT_AMBULATORY_CARE_PROVIDER_SITE_OTHER): Payer: 59 | Admitting: Family Medicine

## 2023-02-10 ENCOUNTER — Encounter: Payer: Self-pay | Admitting: Family Medicine

## 2023-02-10 VITALS — BP 108/72 | HR 79 | Temp 98.3°F | Wt 164.0 lb

## 2023-02-10 DIAGNOSIS — Z1211 Encounter for screening for malignant neoplasm of colon: Secondary | ICD-10-CM | POA: Diagnosis not present

## 2023-02-10 DIAGNOSIS — B029 Zoster without complications: Secondary | ICD-10-CM | POA: Diagnosis not present

## 2023-02-10 MED ORDER — VALACYCLOVIR HCL 1 G PO TABS: 1000.0000 mg | ORAL_TABLET | Freq: Three times a day (TID) | ORAL | 0 refills | Status: AC

## 2023-02-10 NOTE — Progress Notes (Signed)
   Subjective:    Patient ID: Wendy Deleon, female    DOB: 11-29-1970, 52 y.o.   MRN: [id number]  HPI Here for several issues. First she had a positive Cologuard test on 01-06-23, and she was referred to [facility] for an evaluation. However she would like to see Dr. Charna Elizabeth instead. The other issue is a patch of skin on the right buttock that began to itch and burn one week ago. She has applied Desitin with no relief. Sometimes it drains some clear fluid.    Review of Systems  Constitutional: Negative.   Respiratory: Negative.    Cardiovascular: Negative.   Gastrointestinal: Negative.   Skin:  Positive for rash.       Objective:   Physical Exam Constitutional:      Appearance: Normal appearance.  Cardiovascular:     Rate and Rhythm: Normal rate and regular rhythm.     Pulses: Normal pulses.     Heart sounds: Normal heart sounds.  Pulmonary:     Effort: Pulmonary effort is normal.     Breath sounds: Normal breath sounds.  Skin:    Comments: There is a 2 cm patch of red excoriated skin on the right buttock within the intergluteal fold  Neurological:     Mental Status: She is alert.           Assessment & Plan:  For the positive Cologuard test, we put in a referral to Dr. Loreta Ave. The skin problem is due to shingles. We will treat this with 10 days of Valacyclovir. Recheck as needed.  Gershon Crane, MD

## 2023-02-19 ENCOUNTER — Telehealth: Payer: Self-pay | Admitting: Family Medicine

## 2023-02-19 NOTE — Telephone Encounter (Signed)
 I called the patient to follow up.  She stated she was seen by Dr Clent Ridges, a new referral was entered by him and she is awaiting a call regarding the colonoscopy.

## 2023-02-19 NOTE — Telephone Encounter (Signed)
 Copied from CRM [id number]. Topic: Referral - Status >> Feb 19, 2023  3:01 PM Antonio H wrote: Reason for CRM: Patient is wanting a call back from [facility] about referral for colonoscopy, says she has called Dr. Nola office 3 times and they are saying they do not have the referral. She wants someone to give her a call back

## 2023-02-19 NOTE — Telephone Encounter (Signed)
 Patient is needing a copy of her referral printed out for the colonoscopy to be done she stated the office she was referred to does not have the referral she wants to just come pick it up she is worried and would like a call back asap she also stated that the office where she is getting her colonoscopy done at needs some proof that she is a patient of dr dominique she would like a call back

## 2023-02-27 NOTE — Telephone Encounter (Signed)
Pt said dr Loreta Ave is not in her network and I see she does have appt with [facility]

## 2023-03-14 ENCOUNTER — Ambulatory Visit: Payer: 59 | Admitting: Nurse Practitioner

## 2023-03-14 ENCOUNTER — Encounter: Payer: Self-pay | Admitting: Nurse Practitioner

## 2023-03-14 ENCOUNTER — Other Ambulatory Visit (INDEPENDENT_AMBULATORY_CARE_PROVIDER_SITE_OTHER): Payer: 59

## 2023-03-14 VITALS — BP 132/80 | HR 94 | Ht 64.0 in | Wt 166.0 lb

## 2023-03-14 DIAGNOSIS — R195 Other fecal abnormalities: Secondary | ICD-10-CM

## 2023-03-14 DIAGNOSIS — K625 Hemorrhage of anus and rectum: Secondary | ICD-10-CM | POA: Insufficient documentation

## 2023-03-14 DIAGNOSIS — F17210 Nicotine dependence, cigarettes, uncomplicated: Secondary | ICD-10-CM | POA: Diagnosis not present

## 2023-03-14 DIAGNOSIS — K6289 Other specified diseases of anus and rectum: Secondary | ICD-10-CM | POA: Diagnosis not present

## 2023-03-14 LAB — CBC
HCT: 42 % (ref 36.0–46.0)
Hemoglobin: 14.3 g/dL (ref 12.0–15.0)
MCHC: 34 g/dL (ref 30.0–36.0)
MCV: 95.6 fL (ref 78.0–100.0)
Platelets: 238 10*3/uL (ref 150.0–400.0)
RBC: 4.39 Mil/uL (ref 3.87–5.11)
RDW: 12.6 % (ref 11.5–15.5)
WBC: 10.7 10*3/uL — ABNORMAL HIGH (ref 4.0–10.5)

## 2023-03-14 MED ORDER — SUTAB 1479-225-188 MG PO TABS: ORAL_TABLET | ORAL | 0 refills | Status: DC

## 2023-03-14 NOTE — Progress Notes (Signed)
03/14/2023 Wendy Deleon [id number] [birth year]   CHIEF COMPLAINT: Positive Cologuard Test   HISTORY OF PRESENT ILLNESS: Wendy Deleon Wendy Deleon is a 53 year old female with a past medical history of hyperlipidemia and back pain.  She presents today as referred by Dr. Shellia Carwin to schedule a colonoscopy secondary to having a positive Cologuard test 01/06/2023.  She endorses passing a small amount of bright red blood on the toilet tissue with slight stinging to the anal area daily for the past 2 months.  Prior to 2 months ago, she infrequently saw bright red blood on the toilet tissue if she passed a large hard stool.  She denies having any upper or lower abdominal pain no GERD symptoms or dysphagia.  She takes Advil 200 mg 2-3 tabs once to 3 times daily for back pain.  Father with history of colon polyps.  No known family history of colorectal cancer.  She requests a pill form for her bowel prep as she has difficulty drinking any liquids that are thick or sweet.      Latest Ref Rng & Units 03/04/2022    3:06 PM 11/29/2020   11:38 AM 09/23/2020    6:30 AM  CBC  WBC 4.0 - 10.5 K/uL 9.9  11.9  14.7   Hemoglobin 12.0 - 15.0 g/dL 78.2  95.6  21.3   Hematocrit 36.0 - 46.0 % 41.4  40.6  35.5   Platelets 150.0 - 400.0 K/uL 229.0  217.0  184        Latest Ref Rng & Units 03/04/2022    3:06 PM 11/29/2020   11:38 AM 09/20/2020    9:38 AM  CMP  Glucose 70 - 99 mg/dL 70  96  97   BUN 6 - 23 mg/dL 15  19  12    Creatinine 0.40 - 1.20 mg/dL 0.86  5.78  4.69   Sodium 135 - 145 mEq/L 139  136  138   Potassium 3.5 - 5.1 mEq/L 3.9  4.3  3.7   Chloride 96 - 112 mEq/L 103  102  106   CO2 19 - 32 mEq/L 26  25  24    Calcium 8.4 - 10.5 mg/dL 62.9  9.7  9.6   Total Protein 6.0 - 8.3 g/dL 7.6  7.4  7.1   Total Bilirubin 0.2 - 1.2 mg/dL 0.5  0.6  0.5   Alkaline Phos 39 - 117 U/L 109  96  73   AST 0 - 37 U/L 22  13  16    ALT 0 - 35 U/L 20  14  16       Past Medical History:  Diagnosis Date    Allergy    Anxiety    Arthritis    Dysrhythmia    "skips beats" per pcp.    Family history of adverse reaction to anesthesia    father "his body would not wake up from anesthesia, started going into organ failure   History of chicken pox    Past Surgical History:  Procedure Laterality Date   HAND SURGERY Right    2014 x 3,2016   lumbar     DDD-treated by Dr Minus Liberty LAMINECTOMY/DECOMPRESSION MICRODISCECTOMY N/A 09/22/2020   Procedure: MICRODISCECTOMY LEFT L2-3, BIILATERAL MICRODISCECTOMY L4-5 AND L5-S1;  Surgeon: Kerrin Champagne, MD;  Location: [facility] OR;  Service: Orthopedics;  Laterality: N/A;   Social History: She is married.  She has 1 son and 2 daughters.  She smokes 1/2 to  1 ppd x 30 years. Rare alcohol intake. No drug use.   Family History: family history includes Alcohol abuse in her sister; Arthritis in her father, maternal grandfather, maternal grandmother, and mother; Arthritis/Rheumatoid in her paternal grandmother; Asthma in her daughter; Diabetes in her mother and sister; Hearing loss in her father, maternal grandfather, maternal grandmother, and paternal grandmother; Heart attack in her maternal grandfather, maternal grandmother, and paternal grandmother; Heart disease in her maternal grandfather and paternal grandmother; High Cholesterol in her father; High blood pressure in her maternal grandfather and maternal grandmother; Hypertension in her father and mother; Kidney disease in her maternal grandmother, mother, and paternal grandmother; Learning disabilities in her daughter; Migraines in her daughter; Miscarriages / [place] in her mother; Other in her paternal grandfather; Stroke (age of onset: 37) in her maternal grandmother.  Father with history of colon polyps.  No known family history of colorectal cancer.  Allergies  Allergen Reactions   Cyclobenzaprine Anaphylaxis and Shortness Of Breath    Tongue swelling, throat burning, "feels weird to breathe"    Tizanidine Anaphylaxis and Shortness Of Breath    Tongue swelling, throat burning, "feels weird to breathe"   Egg-Derived Products Other (See Comments)    Vomiting, stomach swelling and diarrhea   Oxycodone     Feels over-medicated   Tape Rash    adhesive      Outpatient Encounter Medications as of 03/14/2023  Medication Sig   cetirizine (ZYRTEC) 10 MG tablet Take 10 mg by mouth daily.   ibuprofen (ADVIL) 800 MG tablet Take 1 tablet (800 mg total) by mouth every 8 (eight) hours as needed for moderate pain.   Multiple Vitamins-Minerals (MULTIVITAMIN ADULTS PO) Take 1 tablet by mouth daily.   rosuvastatin (CRESTOR) 20 MG tablet Take 20 mg by mouth daily.   valACYclovir (VALTREX) 1000 MG tablet Take 1 tablet (1,000 mg total) by mouth 3 (three) times daily.   No facility-administered encounter medications on file as of 03/14/2023.   REVIEW OF SYSTEMS:  Gen: Denies fever, sweats or chills. No weight loss.  CV: Denies chest pain, palpitations or edema. Resp: Denies cough, shortness of breath of hemoptysis.  GI: See HPI.  GU: Denies urinary burning, blood in urine, increased urinary frequency or incontinence. MS:+ Back pain.  Derm: Denies rash, itchiness, skin lesions or unhealing ulcers. Psych: + Anxiety.  Heme: Denies bruising, easy bleeding. Neuro:  + Headaches. Endo:  Denies any problems with DM, thyroid or adrenal function.  PHYSICAL EXAM: BP 132/80   Pulse 94   Ht 5\' 4"  (1.626 m)   Wt 166 lb (75.3 kg)   BMI 28.49 kg/m   General: 53 year old female in no acute distress. Head: Normocephalic and atraumatic. Eyes:  Sclerae non-icteric, conjunctive pink. Ears: Normal auditory acuity. Mouth: Dentition intact. No ulcers or lesions.  Neck: Supple, no lymphadenopathy or thyromegaly.  Lungs: Clear bilaterally to auscultation without wheezes, crackles or rhonchi. Heart: Regular rate and rhythm. No murmur, rub or gallop appreciated.  Abdomen: Soft, nontender, nondistended. No  masses. No hepatosplenomegaly. Normoactive bowel sounds x 4 quadrants.  Rectal: Anal folds mildly thickened with several tiny erythematous papules to the left lateral anal area (possible from aggressive wiping).  Small internal hemorrhoids without prolapse.  No blood.  No palpable mass within reach.  Shell CMA present during exam.  Musculoskeletal: Symmetrical with no gross deformities. Skin: Warm and dry. No rash or lesions on visible extremities. Extremities: No edema. Neurological: Alert oriented x 4, no focal deficits.  Psychological:  Alert and cooperative. Normal mood and affect.  ASSESSMENT AND PLAN:  53 year old female with a positive Cologuard test.  Father with history of colon polyps. -Colonoscopy benefits and risks discussed including risk with sedation, risk of bleeding, perforation and infection  -Further recommendations to be determined after colonoscopy completed  Rectal bleeding, mild anal irritation on exam -Colonoscopy as ordered above Apply a small amount of Desitin inside the anal opening and to the external anal area three times daily as needed for anal or hemorrhoidal irritation/bleeding.  -Avoid aggressive wiping after passing a bowel movement -CBC  Chronic tobacco use -Smoking cessation encouraged   CC:  Karie Georges, MD

## 2023-03-14 NOTE — Patient Instructions (Addendum)
You have been scheduled for a colonoscopy. Please follow written instructions given to you at your visit today.   Please pick up your prep supplies at the pharmacy within the next 1-3 days.  If you use inhalers (even only as needed), please bring them with you on the day of your procedure. ____________________________________________ Your provider has requested that you go to the basement level for lab work before leaving today. Press "B" on the elevator. The lab is located at the first door on the left as you exit the elevator.  Apply a small amount of Desitin inside the anal opening and to the external anal area three times daily as needed for anal or hemorrhoidal irritation/bleeding. Do not use Desitin the morning of your colonoscopy.  Due to recent changes in healthcare laws, you may see the results of your imaging and laboratory studies on MyChart before your provider has had a chance to review them.  We understand that in some cases there may be results that are confusing or concerning to you. Not all laboratory results come back in the same time frame and the provider may be waiting for multiple results in order to interpret others.  Please give [place] 48 hours in order for your provider to thoroughly review all the results before contacting the office for clarification of your results.   Thank you for trusting me with your gastrointestinal care!   Alcide Evener, CRNP

## 2023-03-14 NOTE — Progress Notes (Signed)
I agree with the assessment and plan as outlined by Ms. Kennedy-Smith. 

## 2023-04-22 ENCOUNTER — Ambulatory Visit: Payer: 59 | Admitting: Internal Medicine

## 2023-04-22 ENCOUNTER — Encounter: Payer: Self-pay | Admitting: Internal Medicine

## 2023-04-22 VITALS — BP 122/76 | HR 85 | Temp 98.2°F | Resp 16 | Ht 64.0 in | Wt 166.0 lb

## 2023-04-22 DIAGNOSIS — K648 Other hemorrhoids: Secondary | ICD-10-CM | POA: Diagnosis not present

## 2023-04-22 DIAGNOSIS — D128 Benign neoplasm of rectum: Secondary | ICD-10-CM | POA: Diagnosis not present

## 2023-04-22 DIAGNOSIS — D125 Benign neoplasm of sigmoid colon: Secondary | ICD-10-CM | POA: Diagnosis not present

## 2023-04-22 DIAGNOSIS — Z1211 Encounter for screening for malignant neoplasm of colon: Secondary | ICD-10-CM

## 2023-04-22 DIAGNOSIS — D123 Benign neoplasm of transverse colon: Secondary | ICD-10-CM

## 2023-04-22 DIAGNOSIS — R195 Other fecal abnormalities: Secondary | ICD-10-CM

## 2023-04-22 DIAGNOSIS — D124 Benign neoplasm of descending colon: Secondary | ICD-10-CM

## 2023-04-22 DIAGNOSIS — K635 Polyp of colon: Secondary | ICD-10-CM

## 2023-04-22 MED ORDER — SODIUM CHLORIDE 0.9 % IV SOLN: 500.0000 mL | INTRAVENOUS | Status: DC

## 2023-04-22 MED ORDER — HYDROCORTISONE (PERIANAL) 2.5 % EX CREA: 1.0000 | TOPICAL_CREAM | Freq: Two times a day (BID) | CUTANEOUS | 1 refills | Status: AC

## 2023-04-22 NOTE — Progress Notes (Signed)
 GASTROENTEROLOGY PROCEDURE H&P NOTE   Primary Care Physician: Karie Georges, MD    Reason for Procedure:   Positive Cologuard  Plan:    Colonoscopy  Patient is appropriate for endoscopic procedure(s) in the ambulatory ([facility]) setting.  The nature of the procedure, as well as the risks, benefits, and alternatives were carefully and thoroughly reviewed with the patient. Ample time for discussion and questions allowed. The patient understood, was satisfied, and agreed to proceed.     HPI: Wendy Deleon is a 53 y.o. female who presents for colonoscopy for evaluation of positive Cologuard .  Patient was most recently seen in the Gastroenterology Clinic on 03/14/23.  No interval change in medical history since that appointment. Please refer to that note for full details regarding GI history and clinical presentation.   Past Medical History:  Diagnosis Date   Allergy    Anxiety    Arthritis    Dysrhythmia    "skips beats" per pcp.    Family history of adverse reaction to anesthesia    father "his body would not wake up from anesthesia, started going into organ failure   History of chicken pox    Hyperlipidemia     Past Surgical History:  Procedure Laterality Date   HAND SURGERY Right    2014 x 3,2016   lumbar     DDD-treated by Dr Minus Liberty LAMINECTOMY/DECOMPRESSION MICRODISCECTOMY N/A 09/22/2020   Procedure: MICRODISCECTOMY LEFT L2-3, BIILATERAL MICRODISCECTOMY L4-5 AND L5-S1;  Surgeon: Kerrin Champagne, MD;  Location: [facility] OR;  Service: Orthopedics;  Laterality: N/A;    Prior to Admission medications   Medication Sig Start Date End Date Taking? Authorizing Provider  acetaminophen (TYLENOL) 500 MG tablet Take by mouth. 03/29/15  Yes [provider]  cetirizine (ZYRTEC ALLERGY) 10 MG tablet 1 tablet Orally Once a day for 30 day(s)   Yes [provider]  cetirizine (ZYRTEC) 10 MG chewable tablet    Yes [provider]  cetirizine  (ZYRTEC) 10 MG tablet Take 10 mg by mouth daily.   Yes [provider]  Ibuprofen (ADVIL) 200 MG CAPS    Yes [provider]  ibuprofen (ADVIL) 200 MG tablet 1 tablet with food or milk as needed Orally Three times a day   Yes [provider]  Multiple Vitamin (MULTI VITAMIN) TABS 1 tablet Orally Once a day for 30 day(s)   Yes [provider]  Multiple Vitamin (MULTIVITAMIN WITH MINERALS) TABS tablet Take 1 tablet by mouth daily.   Yes [provider]  Multiple Vitamins-Minerals (MULTIVITAMIN ADULTS PO) Take 1 tablet by mouth daily.   Yes [provider]  Pediatric Multivitamins-Fl (MULTIVITAMIN + FLUORIDE) 0.25 MG CHEW    Yes [provider]  rosuvastatin (CRESTOR) 20 MG tablet Take 20 mg by mouth daily.   Yes [provider]  rosuvastatin (CRESTOR) 20 MG tablet Take 1 tablet every day by oral route at dinner. 09/07/21  Yes [provider]  Sodium Sulfate-Mag Sulfate-KCl (SUTAB) [phone] MG TABS Use as directed for colonoscopy. MANUFACTURER CODES!! BIN: [id number] PCN: CN GROUP: [id number] MEMBER ID: [id number];[employer] AS SECONDARY INSURANCE ;NO PRIOR AUTHORIZATION 03/14/23  Yes Arnaldo Natal, NP  ibuprofen (ADVIL) 800 MG tablet Take 1 tablet (800 mg total) by mouth every 8 (eight) hours as needed for moderate pain. 12/31/21   Karie Georges, MD  valACYclovir (VALTREX) 1000 MG tablet Take 1 tablet (1,000 mg total) by mouth 3 (three) times daily. Patient  not taking: Reported on 04/22/2023 02/10/23   Nelwyn Salisbury, MD    Current Outpatient Medications  Medication Sig Dispense Refill   acetaminophen (TYLENOL) 500 MG tablet Take by mouth.     cetirizine (ZYRTEC ALLERGY) 10 MG tablet 1 tablet Orally Once a day for 30 day(s)     cetirizine (ZYRTEC) 10 MG chewable tablet      cetirizine (ZYRTEC) 10 MG tablet Take 10 mg by mouth daily.     Ibuprofen (ADVIL) 200 MG CAPS      ibuprofen (ADVIL) 200 MG tablet 1 tablet  with food or milk as needed Orally Three times a day     Multiple Vitamin (MULTI VITAMIN) TABS 1 tablet Orally Once a day for 30 day(s)     Multiple Vitamin (MULTIVITAMIN WITH MINERALS) TABS tablet Take 1 tablet by mouth daily.     Multiple Vitamins-Minerals (MULTIVITAMIN ADULTS PO) Take 1 tablet by mouth daily.     Pediatric Multivitamins-Fl (MULTIVITAMIN + FLUORIDE) 0.25 MG CHEW      rosuvastatin (CRESTOR) 20 MG tablet Take 20 mg by mouth daily.     rosuvastatin (CRESTOR) 20 MG tablet Take 1 tablet every day by oral route at dinner.     Sodium Sulfate-Mag Sulfate-KCl (SUTAB) [phone] MG TABS Use as directed for colonoscopy. MANUFACTURER CODES!! BIN: [id number] PCN: CN GROUP: [id number] MEMBER ID: [id number];[employer] AS SECONDARY INSURANCE ;NO PRIOR AUTHORIZATION 24 tablet 0   ibuprofen (ADVIL) 800 MG tablet Take 1 tablet (800 mg total) by mouth every 8 (eight) hours as needed for moderate pain. 90 tablet 0   valACYclovir (VALTREX) 1000 MG tablet Take 1 tablet (1,000 mg total) by mouth 3 (three) times daily. (Patient not taking: Reported on 04/22/2023) 30 tablet 0   Current Facility-Administered Medications  Medication Dose Route Frequency Provider Last Rate Last Admin   0.9 %  sodium chloride infusion  500 mL Intravenous Continuous Imogene Burn, MD        Allergies as of 04/22/2023 - Review Complete 04/22/2023  Allergen Reaction Noted   Cyclobenzaprine Anaphylaxis and Shortness Of Breath 03/10/2013   Tizanidine Anaphylaxis and Shortness Of Breath 02/19/2019   Egg-derived products Other (See Comments) 02/19/2019   Oxycodone  02/19/2019   Tape Rash 09/20/2020    Family History  Problem Relation Age of Onset   Arthritis Mother    Diabetes Mother    Hypertension Mother    Kidney disease Mother    Miscarriages / [place] Mother    Colon cancer Father    Arthritis Father    Hearing loss Father    High Cholesterol Father    Hypertension Father    Diabetes Sister    Alcohol abuse  Sister    Arthritis Maternal Grandmother    Hearing loss Maternal Grandmother    Heart attack Maternal Grandmother    High blood pressure Maternal Grandmother    Kidney disease Maternal Grandmother    Stroke Maternal Grandmother 47   Arthritis Maternal Grandfather    Hearing loss Maternal Grandfather    Heart attack Maternal Grandfather    Heart disease Maternal Grandfather    High blood pressure Maternal Grandfather    Arthritis/Rheumatoid Paternal Grandmother    Hearing loss Paternal Grandmother    Heart attack Paternal Grandmother    Heart disease Paternal Grandmother    Kidney disease Paternal Grandmother    Other Paternal Grandfather        no medical history known   Asthma Daughter    Migraines Daughter  Learning disabilities Daughter    Stomach cancer Neg Hx    Esophageal cancer Neg Hx     Social History   Socioeconomic History   Marital status: Married    Spouse name: Shelvie, Salsberry   Number of children: 3   Years of education: Not on file   Highest education level: Not on file  Occupational History   Occupation: disabiity  Tobacco Use   Smoking status: Every Day    Current packs/day: 1.00    Types: Cigarettes   Smokeless tobacco: Never  Vaping Use   Vaping status: Former   Substances: Nicotine  Substance and Sexual Activity   Alcohol use: No   Drug use: Never   Sexual activity: Yes    Partners: Male    Comment: husband with vasectomy  Other Topics Concern   Not on file  Social History Narrative   Not on file   Social Drivers of Health   Financial Resource Strain: Not on file  Food Insecurity: Not on file  Transportation Needs: Not on file  Physical Activity: Not on file  Stress: Not on file  Social Connections: Not on file  Intimate Partner Violence: Not on file    Physical Exam: Vital signs in last 24 hours: BP 139/70   Pulse 90   Temp 98.2 F (36.8 C) (Temporal)   Ht 5\' 4"  (1.626 m)   Wt 166 lb (75.3 kg)   SpO2 98%    BMI 28.49 kg/m  GEN: NAD EYE: Sclerae anicteric ENT: MMM CV: Non-tachycardic Pulm: No increased WOB GI: Soft NEURO:  Alert & Oriented   Eulah Pont, MD [facility]   04/22/2023 1:24 PM

## 2023-04-22 NOTE — Progress Notes (Signed)
 Vss nad trans to pacu

## 2023-04-22 NOTE — Patient Instructions (Signed)
-  Handout on polyps and hemorrhoids provided -await pathology results -repeat colonoscopy for surveillance recommended. Date to be determined when pathology result become available   -Continue present medications   -Start Anusol cream 2 times a day for 7 days  YOU HAD AN ENDOSCOPIC PROCEDURE TODAY AT THE [facility]:   Refer to the procedure report that was given to you for any specific questions about what was found during the examination.  If the procedure report does not answer your questions, please call your gastroenterologist to clarify.  If you requested that your care partner not be given the details of your procedure findings, then the procedure report has been included in a sealed envelope for you to review at your convenience later.  YOU SHOULD EXPECT: Some feelings of bloating in the abdomen. Passage of more gas than usual.  Walking can help get rid of the air that was put into your GI tract during the procedure and reduce the bloating. If you had a lower endoscopy (such as a colonoscopy or flexible sigmoidoscopy) you may notice spotting of blood in your stool or on the toilet paper. If you underwent a bowel prep for your procedure, you may not have a normal bowel movement for a few days.  Please Note:  You might notice some irritation and congestion in your nose or some drainage.  This is from the oxygen used during your procedure.  There is no need for concern and it should clear up in a day or so.  SYMPTOMS TO REPORT IMMEDIATELY:  Following lower endoscopy (colonoscopy or flexible sigmoidoscopy):  Excessive amounts of blood in the stool  Significant tenderness or worsening of abdominal pains  Swelling of the abdomen that is new, acute  Fever of 100F or higher  For urgent or emergent issues, a gastroenterologist can be reached at any hour by calling [phone]. Do not use MyChart messaging for urgent concerns.    DIET:  We do recommend a small meal at first, but  then you may proceed to your regular diet.  Drink plenty of fluids but you should avoid alcoholic beverages for 24 hours.  ACTIVITY:  You should plan to take it easy for the rest of today and you should NOT DRIVE or use heavy machinery until tomorrow (because of the sedation medicines used during the test).    FOLLOW UP: Our staff will call the number listed on your records the next business day following your procedure.  We will call around 7:15- 8:00 am to check on you and address any questions or concerns that you may have regarding the information given to you following your procedure. If we do not reach you, we will leave a message.     If any biopsies were taken you will be contacted by phone or by letter within the next 1-3 weeks.  Please call us at [phone] if you have not heard about the biopsies in 3 weeks.    SIGNATURES/CONFIDENTIALITY: You and/or your care partner have signed paperwork which will be entered into your electronic medical record.  These signatures attest to the fact that that the information above on your After Visit Summary has been reviewed and is understood.  Full responsibility of the confidentiality of this discharge information lies with you and/or your care-partner.

## 2023-04-22 NOTE — Op Note (Signed)
 [facility] Patient Name: Wendy Deleon Procedure Date: 04/22/2023 1:12 PM MRN: [id number] Endoscopist: Particia Lather , , [id number] Age: 53 Referring MD:  Date of Birth: 02-09-1971 Gender: Female Account #: [id number] Procedure:                Colonoscopy Indications:              Positive Cologuard test Medicines:                Monitored Anesthesia Care Procedure:                Pre-Anesthesia Assessment:                           - Prior to the procedure, a History and Physical                            was performed, and patient medications and                            allergies were reviewed. The patient's tolerance of                            previous anesthesia was also reviewed. The risks                            and benefits of the procedure and the sedation                            options and risks were discussed with the patient.                            All questions were answered, and informed consent                            was obtained. Prior Anticoagulants: The patient has                            taken no anticoagulant or antiplatelet agents. ASA                            Grade Assessment: II - A patient with mild systemic                            disease. After reviewing the risks and benefits,                            the patient was deemed in satisfactory condition to                            undergo the procedure.                           After obtaining informed consent, the colonoscope  was passed under direct vision. Throughout the                            procedure, the patient's blood pressure, pulse, and                            oxygen saturations were monitored continuously. The                            CF [id number] #[id number] was introduced through the anus                            and advanced to the the terminal ileum. The                            colonoscopy was performed  without difficulty. The                            patient tolerated the procedure well. The quality                            of the bowel preparation was excellent. The                            terminal ileum, ileocecal valve, appendiceal                            orifice, and rectum were photographed. Scope In: 1:38:33 PM Scope Out: 1:57:02 PM Scope Withdrawal Time: 0 hours 14 minutes 50 seconds  Total Procedure Duration: 0 hours 18 minutes 29 seconds  Findings:                 The terminal ileum appeared normal.                           Seven sessile polyps were found in the rectum,                            sigmoid colon, descending colon and transverse                            colon. The polyps were 3 to 10 mm in size. These                            polyps were removed with a cold snare. Resection                            and retrieval were complete.                           Non-bleeding internal hemorrhoids were found during                            retroflexion. Complications:  No immediate complications. Estimated Blood Loss:     Estimated blood loss was minimal. Impression:               - The examined portion of the ileum was normal.                           - Seven 3 to 10 mm polyps in the rectum, in the                            sigmoid colon, in the descending colon and in the                            transverse colon, removed with a cold snare.                            Resected and retrieved.                           - Non-bleeding internal hemorrhoids. Recommendation:           - Discharge patient to home (with escort).                           - Await pathology results.                           - Anusol HC cream BID for 7 days                           - The findings and recommendations were discussed                            with the patient. Dr Particia Lather "Alan Ripper" Leonides Schanz,  04/22/2023 2:02:07 PM

## 2023-04-22 NOTE — Progress Notes (Signed)
 Called to room to assist during endoscopic procedure.  Patient ID and intended procedure confirmed with present staff. Received instructions for my participation in the procedure from the performing physician.

## 2023-04-23 ENCOUNTER — Telehealth: Payer: Self-pay | Admitting: *Deleted

## 2023-04-23 NOTE — Telephone Encounter (Signed)
  Follow up Call-     04/22/2023   12:39 PM  Call back number  Post procedure Call Back phone  # [phone]  Permission to leave phone message Yes     Patient questions:  Do you have a fever, pain , or abdominal swelling? No. Pain Score  0 *  Have you tolerated food without any problems? Yes.    Have you been able to return to your normal activities? Yes.    Do you have any questions about your discharge instructions: Diet   No. Medications  No. Follow up visit  No.  Do you have questions or concerns about your Care? No.  Actions: * If pain score is 4 or above: No action needed, pain <4.

## 2023-04-25 ENCOUNTER — Encounter: Payer: Self-pay | Admitting: Internal Medicine

## 2023-04-25 LAB — SURGICAL PATHOLOGY

## 2023-11-18 ENCOUNTER — Other Ambulatory Visit: Payer: Self-pay | Admitting: Obstetrics & Gynecology

## 2023-11-18 ENCOUNTER — Telehealth: Payer: Self-pay | Admitting: Family Medicine

## 2023-11-18 DIAGNOSIS — Z1231 Encounter for screening mammogram for malignant neoplasm of breast: Secondary | ICD-10-CM

## 2023-11-18 NOTE — Telephone Encounter (Signed)
 Copied from CRM #[id number]. Topic: Referral - Request for Referral >> Nov 18, 2023 10:51 AM Rea BROCKS wrote: Did the patient discuss referral with their provider in the last year? Yes  (If No - schedule appointment) (If Yes - send message)  Appointment offered? No   Type of order/referral and detailed reason for visit: Patient had an order for a mammogram but due to her symptoms imaging stated that they need a Referral for Diagnostic test and 3D imaging and ultrasound.   Preference of office, provider, location: [facility] in [place] on [address]   If referral order, have you been seen by this specialty before? Yes (If Yes, this issue or another issue? When? Where?  Can we respond through MyChart? No. Phone contact only. Patient does not utilize Clinical cytogeneticist.

## 2023-11-18 NOTE — Telephone Encounter (Signed)
 Spoke with the patient and informed her a visit is needed first for evaluation prior to ordering tests below-last visit with PCP was over 1 yr ago.  Appt was scheduled with Dr Swaziland on 10/8.

## 2023-11-19 ENCOUNTER — Encounter: Payer: Self-pay | Admitting: Family Medicine

## 2023-11-19 ENCOUNTER — Ambulatory Visit: Payer: Self-pay | Admitting: Family Medicine

## 2023-11-19 VITALS — BP 138/90 | HR 90 | Temp 97.9°F | Resp 16 | Ht 64.0 in | Wt 172.0 lb

## 2023-11-19 DIAGNOSIS — N6019 Diffuse cystic mastopathy of unspecified breast: Secondary | ICD-10-CM

## 2023-11-19 DIAGNOSIS — R030 Elevated blood-pressure reading, without diagnosis of hypertension: Secondary | ICD-10-CM

## 2023-11-19 DIAGNOSIS — N644 Mastodynia: Secondary | ICD-10-CM

## 2023-11-19 NOTE — Patient Instructions (Addendum)
 A few things to remember from today's visit:  Breast tenderness in female - Plan: MM 3D DIAGNOSTIC MAMMOGRAM BILATERAL BREAST, US  LIMITED ULTRASOUND INCLUDING AXILLA RIGHT BREAST  Fibrocystic breast disease (FCBD) in female, unspecified laterality  Diagnostic mammogram will be arranged. Monitor for new symptoms.

## 2023-11-19 NOTE — Progress Notes (Signed)
 ACUTE VISIT Chief Complaint  Patient presents with   Acute Visit    Right breast swelling    Discussed the use of AI scribe software for clinical note transcription with the patient, who gave verbal consent to proceed. History of Present Illness Wendy Deleon is a 53 year old female with past medical history significant for hyperlipidemia and chronic back pain who presents with right breast swelling and pain.  She has experienced swelling in her right breast for the past month, lateral aspect of breat. States that she typically has bilateral breast tenderness before her menstrual period but has persisted beyond her cycle this time.  Pain, soreness, rated as 3 to 4 out of 10 in intensity, pain sometimes extending to the armpit. The pain was constant for about two weeks. She has not experienced any trauma to the area, although her cat and dogs occasionally lay or jump on her chest, which is not new behavior.  No changes in the skin, masses,enlarged lymph nodes, or nipple discharge have been noted.  LMP: November 01, 2023.  She does not use birth control as her husband had a vasectomy.  She has a family history of breast cancer on her father's side, as her aunt had breast cancer. She has not had a mammogram since 2023.  She regularly takes Advil  for back pain, which she also uses to manage the breast pain.   Diagnostic mammogram in 08/2021 [facility].  Review of Systems  Constitutional:  Negative for activity change, appetite change, chills and fever.  HENT:  Negative for sore throat.   Respiratory:  Negative for cough and shortness of breath.   Cardiovascular:  Negative for chest pain.  Gastrointestinal:  Negative for abdominal pain and nausea.  Genitourinary:  Negative for menstrual problem.  Skin:  Negative for rash.  See other pertinent positives and negatives in HPI.  Current Outpatient Medications on File Prior to Visit  Medication Sig Dispense Refill    rosuvastatin  (CRESTOR ) 20 MG tablet Take 20 mg by mouth daily.     acetaminophen  (TYLENOL ) 500 MG tablet Take by mouth. (Patient not taking: Reported on 11/19/2023)     cetirizine (ZYRTEC ALLERGY) 10 MG tablet 1 tablet Orally Once a day for 30 day(s) (Patient not taking: Reported on 11/19/2023)     cetirizine (ZYRTEC) 10 MG chewable tablet  (Patient not taking: Reported on 11/19/2023)     cetirizine (ZYRTEC) 10 MG tablet Take 10 mg by mouth daily. (Patient not taking: Reported on 11/19/2023)     hydrocortisone  (ANUSOL -HC) 2.5 % rectal cream Place 1 Application rectally 2 (two) times daily. Use for 7 days (Patient not taking: Reported on 11/19/2023) 30 g 1   Ibuprofen  (ADVIL ) 200 MG CAPS  (Patient not taking: Reported on 11/19/2023)     ibuprofen  (ADVIL ) 200 MG tablet 1 tablet with food or milk as needed Orally Three times a day (Patient not taking: Reported on 11/19/2023)     ibuprofen  (ADVIL ) 800 MG tablet Take 1 tablet (800 mg total) by mouth every 8 (eight) hours as needed for moderate pain. (Patient not taking: Reported on 11/19/2023) 90 tablet 0   Multiple Vitamin (MULTI VITAMIN) TABS 1 tablet Orally Once a day for 30 day(s) (Patient not taking: Reported on 11/19/2023)     Multiple Vitamin (MULTIVITAMIN WITH MINERALS) TABS tablet Take 1 tablet by mouth daily. (Patient not taking: Reported on 11/19/2023)     Multiple Vitamins-Minerals (MULTIVITAMIN ADULTS PO) Take 1 tablet by mouth daily. (  Patient not taking: Reported on 11/19/2023)     Pediatric Multivitamins-Fl (MULTIVITAMIN + FLUORIDE) 0.25 MG CHEW  (Patient not taking: Reported on 11/19/2023)     rosuvastatin  (CRESTOR ) 20 MG tablet Take 1 tablet every day by oral route at dinner. (Patient not taking: Reported on 11/19/2023)     Sodium Sulfate-Mag Sulfate-KCl (SUTAB ) [phone] MG TABS Use as directed for colonoscopy. MANUFACTURER CODES!! BIN: [id number] PCN: CN GROUP: [id number] MEMBER ID: [id number];MLW AS SECONDARY INSURANCE ;NO PRIOR AUTHORIZATION  (Patient not taking: Reported on 11/19/2023) 24 tablet 0   valACYclovir  (VALTREX ) 1000 MG tablet Take 1 tablet (1,000 mg total) by mouth 3 (three) times daily. (Patient not taking: Reported on 11/19/2023) 30 tablet 0   No current facility-administered medications on file prior to visit.    Past Medical History:  Diagnosis Date   Allergy    Anxiety    Arthritis    Dysrhythmia    skips beats per pcp.    Family history of adverse reaction to anesthesia    father his body would not wake up from anesthesia, started going into organ failure   History of chicken pox    Hyperlipidemia    Allergies  Allergen Reactions   Cyclobenzaprine Anaphylaxis and Shortness Of Breath    Tongue swelling, throat burning, feels weird to breathe   Tizanidine Anaphylaxis and Shortness Of Breath    Tongue swelling, throat burning, feels weird to breathe   Egg-Derived Products Other (See Comments)    Vomiting, stomach swelling and diarrhea   Oxycodone     Feels over-medicated   Tape Rash    adhesive    Social History   Socioeconomic History   Marital status: Married    Spouse name: Caroll, Weinheimer   Number of children: 3   Years of education: Not on file   Highest education level: Not on file  Occupational History   Occupation: disabiity  Tobacco Use   Smoking status: Every Day    Current packs/day: 1.00    Types: Cigarettes   Smokeless tobacco: Never  Vaping Use   Vaping status: Former   Substances: Nicotine  Substance and Sexual Activity   Alcohol use: No   Drug use: Never   Sexual activity: Yes    Partners: Male    Comment: husband with vasectomy  Other Topics Concern   Not on file  Social History Narrative   Not on file   Social Drivers of Health   Financial Resource Strain: Not on file  Food Insecurity: Not on file  Transportation Needs: Not on file  Physical Activity: Not on file  Stress: Not on file  Social Connections: Not on file   Vitals:   11/19/23  0750  BP: (!) 138/90  Pulse: 90  Resp: 16  Temp: 97.9 F (36.6 C)  SpO2: 99%   Body mass index is 29.52 kg/m.  Physical Exam Vitals and nursing note reviewed.  Constitutional:      General: She is not in acute distress.    Appearance: She is well-developed.  HENT:     Head: Normocephalic and atraumatic.  Eyes:     Conjunctiva/sclera: Conjunctivae normal.  Pulmonary:     Effort: Pulmonary effort is normal. No respiratory distress.     Breath sounds: Normal breath sounds.  Chest:  Breasts:    Right: Tenderness present. No swelling, mass, nipple discharge or skin change.     Left: No swelling, mass, nipple discharge or skin change.    Lymphadenopathy:  Cervical: No cervical adenopathy.     Upper Body:     Right upper body: No supraclavicular or axillary adenopathy.     Left upper body: No supraclavicular or axillary adenopathy.  Skin:    General: Skin is warm.     Findings: No erythema or rash.  Neurological:     Mental Status: She is alert and oriented to person, place, and time.     Comments: Antalgic gait, not assisted.  Psychiatric:        Mood and Affect: Mood and affect normal.   ASSESSMENT AND PLAN:  Ms. Moncrieffe was seen today for about a month of right breast tenderness.  Breast tenderness in female -     MM 3D DIAGNOSTIC MAMMOGRAM BILATERAL BREAST; Future -     US  LIMITED ULTRASOUND INCLUDING AXILLA RIGHT BREAST; Future  Fibrocystic breast disease (FCBD) in female, unspecified laterality  We discussed possible etiologies, most likely related with fibrocystic breast disease. On examination I did not appreciate masses or any skin changes. Diagnostic mammogram will be arranged. She can continue with OTC ibuprofen  400 mg 3 times daily with food, which she takes regularly to manage chronic back pain.  Elevated blood pressure reading  Mildly elevated today. No history of hypertension, could be related with pain. Recommend monitoring BP at  home.  Return if symptoms worsen or fail to improve.  Hoover Grewe G. Swaz[place], MD  [facility]. [place] office.

## 2023-11-25 ENCOUNTER — Ambulatory Visit
Admission: RE | Admit: 2023-11-25 | Discharge: 2023-11-25 | Disposition: A | Source: Ambulatory Visit | Attending: Family Medicine | Admitting: Family Medicine

## 2023-11-25 ENCOUNTER — Ambulatory Visit
Admission: RE | Admit: 2023-11-25 | Discharge: 2023-11-25 | Disposition: A | Discharge status: Home / Self Care | Source: Ambulatory Visit | Attending: Family Medicine | Admitting: Family Medicine

## 2023-11-25 DIAGNOSIS — N644 Mastodynia: Secondary | ICD-10-CM

## 2023-11-26 ENCOUNTER — Other Ambulatory Visit: Payer: Self-pay | Admitting: Family Medicine

## 2023-11-26 NOTE — Telephone Encounter (Signed)
 Copied from CRM [id number]. Topic: Clinical - Medication Refill >> Nov 26, 2023 10:01 AM Franky GRADE wrote: Medication: rosuvastatin  (CRESTOR ) 20 MG tablet [[id number]]  Has the patient contacted their pharmacy? Yes, they asked patient to contact the office.  (Agent: If no, request that the patient contact the pharmacy for the refill. If patient does not wish to contact the pharmacy document the reason why and proceed with request.) (Agent: If yes, when and what did the pharmacy advise?)  This is the patient's preferred pharmacy:  [id number] [place], [place] - [address] AT [facility] [address] [facility] [address] [place] [address] Phone: [phone] Fax: [phone]    Is this the correct pharmacy for this prescription? Yes If no, delete pharmacy and type the correct one.   Has the prescription been filled recently? No  Is the patient out of the medication? Yes  Has the patient been seen for an appointment in the last year OR does the patient have an upcoming appointment? Yes  Can we respond through MyChart? No  Agent: Please be advised that Rx refills may take up to 3 business days. We ask that you follow-up with your pharmacy.

## 2023-11-28 MED ORDER — ROSUVASTATIN CALCIUM 20 MG PO TABS: 20.0000 mg | ORAL_TABLET | Freq: Every day | ORAL | 0 refills | Status: DC

## 2023-12-05 ENCOUNTER — Ambulatory Visit (INDEPENDENT_AMBULATORY_CARE_PROVIDER_SITE_OTHER): Admitting: Family Medicine

## 2023-12-05 ENCOUNTER — Encounter: Payer: Self-pay | Admitting: Family Medicine

## 2023-12-05 VITALS — BP 134/82 | HR 87 | Temp 98.7°F | Ht 64.0 in | Wt 169.8 lb

## 2023-12-05 DIAGNOSIS — M5441 Lumbago with sciatica, right side: Secondary | ICD-10-CM

## 2023-12-05 DIAGNOSIS — G8929 Other chronic pain: Secondary | ICD-10-CM

## 2023-12-05 DIAGNOSIS — Z9889 Other specified postprocedural states: Secondary | ICD-10-CM | POA: Diagnosis not present

## 2023-12-05 DIAGNOSIS — E785 Hyperlipidemia, unspecified: Secondary | ICD-10-CM | POA: Diagnosis not present

## 2023-12-05 DIAGNOSIS — Z0000 Encounter for general adult medical examination without abnormal findings: Secondary | ICD-10-CM

## 2023-12-05 DIAGNOSIS — F17210 Nicotine dependence, cigarettes, uncomplicated: Secondary | ICD-10-CM

## 2023-12-05 LAB — COMPREHENSIVE METABOLIC PANEL WITH GFR
ALT: 21 U/L (ref 0–35)
AST: 19 U/L (ref 0–37)
Albumin: 4.4 g/dL (ref 3.5–5.2)
Alkaline Phosphatase: 115 U/L (ref 39–117)
BUN: 25 mg/dL — ABNORMAL HIGH (ref 6–23)
CO2: 25 meq/L (ref 19–32)
Calcium: 9.9 mg/dL (ref 8.4–10.5)
Chloride: 104 meq/L (ref 96–112)
Creatinine, Ser: 0.58 mg/dL (ref 0.40–1.20)
GFR: 104.52 mL/min (ref >60.00)
Glucose, Bld: 89 mg/dL (ref 70–99)
Potassium: 4.3 meq/L (ref 3.5–5.1)
Sodium: 137 meq/L (ref 135–145)
Total Bilirubin: 0.6 mg/dL (ref 0.2–1.2)
Total Protein: 7.5 g/dL (ref 6.0–8.3)

## 2023-12-05 LAB — CBC WITH DIFFERENTIAL/PLATELET
Basophils Absolute: 0.1 K/uL (ref 0.0–0.1)
Basophils Relative: 0.8 % (ref 0.0–3.0)
Eosinophils Absolute: 0.4 K/uL (ref 0.0–0.7)
Eosinophils Relative: 4.7 % (ref 0.0–5.0)
HCT: 42.9 % (ref 36.0–46.0)
Hemoglobin: 14.2 g/dL (ref 12.0–15.0)
Lymphocytes Relative: 23.8 % (ref 12.0–46.0)
Lymphs Abs: 2.1 K/uL (ref 0.7–4.0)
MCHC: 33 g/dL (ref 30.0–36.0)
MCV: 95.3 fl (ref 78.0–100.0)
Monocytes Absolute: 0.7 K/uL (ref 0.1–1.0)
Monocytes Relative: 8.4 % (ref 3.0–12.0)
Neutro Abs: 5.4 K/uL (ref 1.4–7.7)
Neutrophils Relative %: 62.3 % (ref 43.0–77.0)
Platelets: 193 K/uL (ref 150.0–400.0)
RBC: 4.5 Mil/uL (ref 3.87–5.11)
RDW: 12.9 % (ref 11.5–15.5)
WBC: 8.7 K/uL (ref 4.0–10.5)

## 2023-12-05 LAB — LIPID PANEL
Cholesterol: 202 mg/dL — ABNORMAL HIGH (ref 0–200)
HDL: 57.7 mg/dL (ref >39.00)
LDL Cholesterol: 126 mg/dL — ABNORMAL HIGH (ref 0–99)
NonHDL: 144.51
Total CHOL/HDL Ratio: 4
Triglycerides: 92 mg/dL (ref 0.0–149.0)
VLDL: 18.4 mg/dL (ref 0.0–40.0)

## 2023-12-05 MED ORDER — ROSUVASTATIN CALCIUM 20 MG PO TABS: 20.0000 mg | ORAL_TABLET | Freq: Every day | ORAL | 1 refills | Status: AC

## 2023-12-05 NOTE — Progress Notes (Signed)
 Complete physical exam  Patient: Wendy Deleon   DOB: [birth year]   53 y.o. Female  MRN: [id number]  Subjective:    Chief Complaint  Patient presents with   Annual Exam    Wendy Deleon is a 53 y.o. female who presents today for a complete physical exam. She reports consuming a general diet. Is trying to watch her diet, does not eat fruits or veggies on a daily basis. Eats a lot of chicken, occasional fish. Trying to stay away from fast food and ultraprocessed foods. Home exercise routine includes some walking, but her back is often bothering her. She generally feels well. She reports sleeping poorly. Wakes up often at night due to her back pain. She does not have additional problems to discuss today.    Most recent fall risk assessment:    12/05/2023    9:50 AM  Fall Risk   Falls in the past year? 0  Number falls in past yr: 0  Injury with Fall? 0  Risk for fall due to : No Fall Risks  Follow up Falls evaluation completed     Most recent depression screenings:    12/05/2023    9:51 AM 11/19/2023    7:58 AM  PHQ 2/9 Scores  PHQ - 2 Score 0 0  PHQ- 9 Score 0 2    Vision:Within last year and Dental: No current dental problems and Receives regular dental care  Patient Active Problem List   Diagnosis Date Noted   Rectal bleeding 03/14/2023   Tobacco use disorder 03/04/2022   Status post lumbar laminectomy 09/22/2020   Herniation of lumbar intervertebral disc with radiculopathy    Other spondylosis with radiculopathy, lumbar region    Chronic hand pain, right 02/20/2019   Chronic low back pain 02/20/2019   Hyperlipidemia 02/20/2019      Patient Care Team: Ozell Heron HERO, MD as PCP - General (Family Medicine) Rox Charleston, MD as Consulting Physician (Obstetrics and Gynecology)   Outpatient Medications Prior to Visit  Medication Sig   cetirizine (ZYRTEC) 10 MG tablet Take 10 mg by mouth daily.   Multiple Vitamins-Minerals (MULTIVITAMIN  ADULTS PO) Take 1 tablet by mouth daily.   [DISCONTINUED] rosuvastatin  (CRESTOR ) 20 MG tablet Take 1 tablet (20 mg total) by mouth daily.   acetaminophen  (TYLENOL ) 500 MG tablet Take by mouth. (Patient not taking: Reported on 11/19/2023)   hydrocortisone  (ANUSOL -HC) 2.5 % rectal cream Place 1 Application rectally 2 (two) times daily. Use for 7 days (Patient not taking: Reported on 11/19/2023)   valACYclovir  (VALTREX ) 1000 MG tablet Take 1 tablet (1,000 mg total) by mouth 3 (three) times daily. (Patient not taking: Reported on 12/05/2023)   [DISCONTINUED] cetirizine (ZYRTEC ALLERGY) 10 MG tablet 1 tablet Orally Once a day for 30 day(s) (Patient not taking: Reported on 11/19/2023)   [DISCONTINUED] cetirizine (ZYRTEC) 10 MG chewable tablet  (Patient not taking: Reported on 11/19/2023)   [DISCONTINUED] Ibuprofen  (ADVIL ) 200 MG CAPS  (Patient not taking: Reported on 11/19/2023)   [DISCONTINUED] Ibuprofen  (ADVIL ) 200 MG CAPS 1 tablet with food or milk as needed Orally Three times a day   [DISCONTINUED] ibuprofen  (ADVIL ) 200 MG tablet 1 tablet with food or milk as needed Orally Three times a day (Patient not taking: Reported on 11/19/2023)   [DISCONTINUED] ibuprofen  (ADVIL ) 800 MG tablet Take 1 tablet (800 mg total) by mouth every 8 (eight) hours as needed for moderate pain. (Patient not taking: Reported on 11/19/2023)   [DISCONTINUED] Multiple Vitamin (MULTI  VITAMIN) TABS 1 tablet Orally Once a day for 30 day(s) (Patient not taking: Reported on 11/19/2023)   [DISCONTINUED] Multiple Vitamin (MULTIVITAMIN WITH MINERALS) TABS tablet Take 1 tablet by mouth daily. (Patient not taking: Reported on 11/19/2023)   [DISCONTINUED] Pediatric Multivitamins-Fl (MULTIVITAMIN + FLUORIDE) 0.25 MG CHEW  (Patient not taking: Reported on 11/19/2023)   [DISCONTINUED] rosuvastatin  (CRESTOR ) 20 MG tablet Take 1 tablet every day by oral route at dinner. (Patient not taking: Reported on 11/19/2023)   [DISCONTINUED] Sodium Sulfate-Mag  Sulfate-KCl (SUTAB ) [phone] MG TABS Use as directed for colonoscopy. MANUFACTURER CODES!! BIN: [id number] PCN: CN GROUP: [id number] MEMBER ID: [id number];MLW AS SECONDARY INSURANCE ;NO PRIOR AUTHORIZATION (Patient not taking: Reported on 12/05/2023)   No facility-administered medications prior to visit.    Review of Systems  HENT:  Negative for hearing loss.   Eyes:  Negative for blurred vision.  Respiratory:  Negative for shortness of breath.   Cardiovascular:  Negative for chest pain.  Gastrointestinal: Negative.   Genitourinary: Negative.   Musculoskeletal:  Negative for back pain.  Neurological:  Negative for headaches.  Psychiatric/Behavioral:  Negative for depression.   All other systems reviewed and are negative.      Objective:     BP 134/82   Pulse 87   Temp 98.7 F (37.1 C) (Oral)   Ht 5' 4 (1.626 m)   Wt 169 lb 12.8 oz (77 kg)   LMP 11/01/2023 (Approximate) Comment: Husband has vasectomy  SpO2 98%   BMI 29.15 kg/m    Physical Exam Vitals reviewed.  Constitutional:      Appearance: Normal appearance. She is well-groomed and normal weight.  HENT:     Right Ear: Tympanic membrane and ear canal normal.     Left Ear: Tympanic membrane and ear canal normal.     Mouth/Throat:     Mouth: Mucous membranes are moist.     Pharynx: No posterior oropharyngeal erythema.  Eyes:     Conjunctiva/sclera: Conjunctivae normal.  Neck:     Thyroid : No thyromegaly.  Cardiovascular:     Rate and Rhythm: Normal rate and regular rhythm.     Pulses: Normal pulses.     Heart sounds: S1 normal and S2 normal.  Pulmonary:     Effort: Pulmonary effort is normal.     Breath sounds: Normal air entry. Wheezing (mild inspiratory on the LLL) present.  Abdominal:     General: Abdomen is flat. Bowel sounds are normal.     Palpations: Abdomen is soft.  Musculoskeletal:     Right lower leg: No edema.     Left lower leg: No edema.  Lymphadenopathy:     Cervical: No cervical  adenopathy.  Neurological:     Mental Status: She is alert and oriented to person, place, and time. Mental status is at baseline.     Gait: Gait is intact.  Psychiatric:        Mood and Affect: Mood and affect normal.        Speech: Speech normal.        Behavior: Behavior normal.        Judgment: Judgment normal.      No results found for any visits on 12/05/23.     Assessment & Plan:    Routine Health Maintenance and Physical Exam  Immunization History  Administered Date(s) Administered   PFIZER(Purple Top)SARS-COV-2 Vaccination 09/13/2019, 10/04/2019   Tdap 06/27/2012    Health Maintenance  Topic Date Due   Medicare Annual Wellness (AWV)  Never done   HIV Screening  Never done   Hepatitis C Screening  Never done   Lung Cancer Screening  Never done   COVID-19 Vaccine (3 - 2025-26 season) 10/13/2023   Zoster Vaccines- Shingrix (1 of 2) 03/06/2024 (Originally 02/26/2022)   Influenza Vaccine  05/11/2024 (Originally 09/12/2023)   Cervical Cancer Screening (HPV/Pap Cotest)  12/04/2024 (Originally 06/06/2023)   DTaP/Tdap/Td (2 - Td or Tdap) 12/04/2024 (Originally 06/28/2022)   Pneumococcal Vaccine: 50+ Years (1 of 2 - PCV) 12/04/2024 (Originally 02/27/1991)   Hepatitis B Vaccines 19-59 Average Risk (1 of 3 - 19+ 3-dose series) 12/04/2024 (Originally 02/27/1991)   Mammogram  11/24/2025   Fecal DNA (Cologuard)  01/05/2026   HPV VACCINES  Aged Out   Meningococcal B Vaccine  Aged Out   Colonoscopy  Discontinued    Discussed health benefits of physical activity, and encouraged her to engage in regular exercise appropriate for her age and condition.  Routine general medical examination at a health care facility -     CBC with Differential/Platelet; Future -     Comprehensive metabolic panel with GFR; Future  Chronic midline low back pain with right-sided sciatica -     Ambulatory referral to Orthopedics  Status post lumbar laminectomy -     Ambulatory referral to  Orthopedics  Hyperlipidemia, unspecified hyperlipidemia type -     Lipid panel; Future -     Rosuvastatin  Calcium ; Take 1 tablet (20 mg total) by mouth daily.  Dispense: 90 tablet; Refill: 1  Heavy cigarette smoker -     CT CHEST LUNG CANCER SCREENING LOW DOSE WO CONTRAST; Future  General physical exam findings are normal today. I reviewed the patient's preventative testing, immunizations, and lifestyle habits. I made appropriate recommendations and placed orders for the appropriate tests and/or vaccinations. I counseled the patient on the CDC's recommendations for healthy exercise and diet. I counseled the patient on healthy sleep habits and stress management. Handouts to reinforce the counseling were given at the conclusion of the visit.    Return in 1 year (on 12/04/2024).     Heron CHRISTELLA Sharper, MDr

## 2023-12-05 NOTE — Patient Instructions (Signed)

## 2023-12-11 ENCOUNTER — Ambulatory Visit
Admission: RE | Admit: 2023-12-11 | Discharge: 2023-12-11 | Disposition: A | Discharge status: Home / Self Care | Source: Ambulatory Visit | Attending: Family Medicine | Admitting: Family Medicine

## 2023-12-11 DIAGNOSIS — F17210 Nicotine dependence, cigarettes, uncomplicated: Secondary | ICD-10-CM

## 2023-12-12 ENCOUNTER — Ambulatory Visit: Payer: Self-pay | Admitting: Family Medicine

## 2023-12-12 DIAGNOSIS — K7689 Other specified diseases of liver: Secondary | ICD-10-CM

## 2023-12-18 ENCOUNTER — Telehealth: Payer: Self-pay | Admitting: *Deleted

## 2023-12-18 NOTE — Telephone Encounter (Signed)
 Copied from CRM [id number]. Topic: Clinical - Lab/Test Results >> Dec 18, 2023 10:23 AM Robinson H wrote: Reason for CRM: Patient returning call to Idell Caldron regarding results per message on file.  [place] [phone]

## 2023-12-18 NOTE — Telephone Encounter (Signed)
 Patient informed of the results and is aware to call [facility] back regarding US .

## 2023-12-18 NOTE — Telephone Encounter (Signed)
 Pt called returning call. Let pt know that nurse isn't available and that she will give her a call back.

## 2023-12-18 NOTE — Telephone Encounter (Signed)
 See results note.

## 2023-12-23 ENCOUNTER — Ambulatory Visit
Admission: RE | Admit: 2023-12-23 | Discharge: 2023-12-23 | Disposition: A | Discharge status: Home / Self Care | Source: Ambulatory Visit | Attending: Family Medicine | Admitting: Family Medicine

## 2023-12-23 DIAGNOSIS — K7689 Other specified diseases of liver: Secondary | ICD-10-CM

## 2023-12-24 ENCOUNTER — Ambulatory Visit: Payer: Self-pay | Admitting: Family Medicine

## 2024-01-14 ENCOUNTER — Ambulatory Visit: Admitting: Orthopedic Surgery

## 2024-01-14 ENCOUNTER — Other Ambulatory Visit: Payer: Self-pay

## 2024-01-14 VITALS — BP 132/84 | HR 78 | Ht 64.0 in | Wt 170.0 lb

## 2024-01-14 DIAGNOSIS — G8929 Other chronic pain: Secondary | ICD-10-CM | POA: Diagnosis not present

## 2024-01-14 DIAGNOSIS — M5441 Lumbago with sciatica, right side: Secondary | ICD-10-CM

## 2024-01-14 DIAGNOSIS — Z720 Tobacco use: Secondary | ICD-10-CM | POA: Diagnosis not present

## 2024-01-14 MED ORDER — METHYLPREDNISOLONE 4 MG PO TBPK: ORAL_TABLET | ORAL | 0 refills | Status: AC

## 2024-01-14 MED ORDER — PREGABALIN 75 MG PO CAPS: 75.0000 mg | ORAL_CAPSULE | Freq: Two times a day (BID) | ORAL | 1 refills | Status: AC

## 2024-01-14 NOTE — Progress Notes (Signed)
 Orthopedic Spine Surgery Office Note  Assessment: Patient is a 53 y.o. female with low back pain that radiates into the right posterolateral thigh and leg, suspect radiculopathy   Plan: -Explained that initially conservative treatment is tried as a significant number of patients may experience relief with these treatment modalities. Discussed that the conservative treatments include:  -activity modification  -physical therapy  -over the counter pain medications  -medrol  dosepak  -lumbar steroid injections -Patient has tried heating pad, advil   -Recommended continue with the advil .  I prescribed Lyrica and a Medrol  Dosepak for additional pain relief -If she is not doing any better at her next visit, we will order an MRI of the lumbar spine to evaluate for radiculopathy -Would need to be nicotine free prior to any elective spine surgery -Patient should return to office in 6 weeks, x-rays at next visit: none   I spent talking to the patient about the health risks associated with smoking and encouraged cessation. Explained that from a surgical perspective, smoking increases the risk of infection, wound healing complications, and nonunion. Patient expressed understanding and patient is already working on quitting. She is going to continue to cut back until she stops using nicotine altogether.    ___________________________________________________________________________   History:  Patient is a 53 y.o. female who presents today for lumbar spine.  Patient has noticed pain in her low back going into the right lower extremity for about 6 months now.  There was no trauma or injury that preceded the onset of the pain.  She feels the pain going into the posterolateral aspect of the thigh and leg to the level of the ankle.  She describes it as a sharp pain and sometimes it feels like a cramping.  She also feels the pain in the right buttock.  She notes the pain is worse with activity but she also  feels it at rest.  She has no pain in the left lower extremity.  This pain feels similar to her prior radicular pain she had before her surgery with Dr. Lucilla.  No bowel or bladder incontinence.  No saddle anesthesia.  Feels the right leg is weak at times and sometimes will give out on her.  She has not had any falls.  Treatments tried: heating pad, advil   Review of systems: Denies fevers and chills, night sweats, unexplained weight loss, history of cancer.  Has had pain that wakes her at night  Past medical history: HLD Anxiety  Allergies: cyclobenzaprine, tizanidine, oxycodone, adhesive tape  Past surgical history:  L2/3, L45, L5/S1 microdiscectomy Hand surgery  Social history: Reports use of nicotine product (smoking, vaping, patches, smokeless) Alcohol use: denies Denies recreational drug use   Physical Exam:  BMI of 29.2  General: no acute distress, appears stated age Neurologic: alert, answering questions appropriately, following commands Respiratory: unlabored breathing on room air, symmetric chest rise Psychiatric: appropriate affect, normal cadence to speech   MSK (spine):  -Strength exam      Left  Right EHL    5/5  5/5 TA    5/5  5/5 GSC    5/5  5/5 Knee extension  5/5  5/5 Hip flexion   5/5  5/5  -Sensory exam    Sensation intact to light touch in L3-S1 nerve distributions of bilateral lower extremities  -Achilles DTR: 2/4 on the left, 2/4 on the right -Patellar tendon DTR: 2/4 on the left, 2/4 on the right  -Straight leg raise: negative bilaterally -Clonus: no beats bilaterally  -  Left hip exam: No pain through range of motion -Right hip exam: No pain through range of motion  Imaging: XRs of the lumbar spine from 01/14/2024 were independently reviewed and interpreted, showing disc height loss at L5/S1.  No other significant degenerative changes seen.  No evidence of instability on flexion/extension views.  No fracture or dislocation  seen.   Patient name: Wendy Deleon Patient MRN: [id number] Date of visit: 01/14/24

## 2024-03-01 ENCOUNTER — Ambulatory Visit: Admitting: Orthopedic Surgery

## 2024-03-01 DIAGNOSIS — M5416 Radiculopathy, lumbar region: Secondary | ICD-10-CM

## 2024-03-01 NOTE — Progress Notes (Signed)
 Orthopedic Spine Surgery Office Note   Assessment: Patient is a 54 y.o. female with low back pain that radiates into the right posterolateral thigh and leg, suspect radiculopathy. Back pain > leg pain     Plan: -Patient has tried heating pad, advil , medrol  dose pak, gabapentin, lyrica  Patient has tried over 6 weeks of conservative treatment without improvement in her pain, so recommend an MRI of the lumbar spine to evaluate for radiculopathy -Would need to be nicotine free prior to any elective spine surgery -Patient should return to office in 4-5 weeks, x-rays at next visit: none     ___________________________________________________________________________     History:   Patient is a 54 y.o. female who presents today for follow up on her lumbar spine.  Patient continues to note low back and right lower extremity leg pain.  She feels it going into the posterior lateral aspect of the thigh and leg to the level of the ankle.  She said that after our last visit, her leg pain has gotten better.  It still present but is not as bad as it was.  Her back pain is more significant now.  She notes low back pain with even rotation at the hip joint.  She has no pain going into the left lower extremity.  She felt that the Medrol  Dosepak was helpful.  She said that that was the best that she has felt in the last year and a half.  She said it helped for about a week and a half.  She tried the Lyrica  but noticed that it made her dizzy so she stopped taking it.  She has not developed any new symptoms since she was last in the office.    Treatments tried: heating pad, advil , medrol  dose pak, gabapentin, lyrica      Physical Exam:   General: no acute distress, appears stated age Neurologic: alert, answering questions appropriately, following commands Respiratory: unlabored breathing on room air, symmetric chest rise Psychiatric: appropriate affect, normal cadence to speech     MSK (spine):   -Strength  exam                                                   Left                  Right EHL                              5/5                  5/5 TA                                 5/5                  5/5 GSC                             5/5                  5/5 Knee extension            5/5  5/5 Hip flexion                    5/5                  5/5   -Sensory exam                           Sensation intact to light touch in L3-S1 nerve distributions of bilateral lower extremities    Imaging: XRs of the lumbar spine from 01/14/2024 were previously independently reviewed and interpreted, showing disc height loss at L5/S1.  No other significant degenerative changes seen.  No evidence of instability on flexion/extension views.  No fracture or dislocation seen.     Patient name: Wendy Deleon Patient MRN: [id number] Date of visit: 03/01/24

## 2024-03-11 ENCOUNTER — Encounter: Payer: Self-pay | Admitting: Orthopedic Surgery

## 2024-03-15 ENCOUNTER — Other Ambulatory Visit

## 2024-03-25 ENCOUNTER — Other Ambulatory Visit
